# Patient Record
Sex: Male | Born: 1935 | Race: White | Hispanic: No | Marital: Married
Health system: Southern US, Community
[De-identification: ages and names within clinical notes are randomized; demographics above are authoritative.]

---

## 2003-11-25 ENCOUNTER — Ambulatory Visit: Payer: Self-pay | Admitting: Ophthalmology

## 2003-12-02 ENCOUNTER — Ambulatory Visit: Payer: Self-pay | Admitting: Ophthalmology

## 2004-03-23 ENCOUNTER — Ambulatory Visit: Payer: Self-pay | Admitting: Unknown Physician Specialty

## 2004-04-11 ENCOUNTER — Ambulatory Visit: Payer: Self-pay

## 2004-08-29 ENCOUNTER — Ambulatory Visit: Payer: Self-pay | Admitting: Unknown Physician Specialty

## 2004-09-21 ENCOUNTER — Emergency Department: Payer: Self-pay | Admitting: Emergency Medicine

## 2004-10-01 ENCOUNTER — Emergency Department: Payer: Self-pay | Admitting: Unknown Physician Specialty

## 2004-11-12 ENCOUNTER — Emergency Department: Payer: Self-pay | Admitting: Emergency Medicine

## 2005-02-09 ENCOUNTER — Emergency Department: Payer: Self-pay | Admitting: Emergency Medicine

## 2005-02-09 ENCOUNTER — Ambulatory Visit: Payer: Self-pay | Admitting: Family Medicine

## 2005-02-10 ENCOUNTER — Inpatient Hospital Stay: Payer: Self-pay | Admitting: Internal Medicine

## 2005-02-10 ENCOUNTER — Other Ambulatory Visit: Payer: Self-pay

## 2005-02-13 ENCOUNTER — Other Ambulatory Visit: Payer: Self-pay

## 2005-02-24 ENCOUNTER — Emergency Department: Payer: Self-pay | Admitting: Emergency Medicine

## 2005-03-22 ENCOUNTER — Encounter: Payer: Self-pay | Admitting: Internal Medicine

## 2005-04-13 ENCOUNTER — Encounter: Payer: Self-pay | Admitting: Internal Medicine

## 2005-05-14 ENCOUNTER — Encounter: Payer: Self-pay | Admitting: Internal Medicine

## 2006-03-05 ENCOUNTER — Encounter: Payer: Self-pay | Admitting: Internal Medicine

## 2006-03-16 ENCOUNTER — Encounter: Payer: Self-pay | Admitting: Internal Medicine

## 2006-04-04 ENCOUNTER — Ambulatory Visit: Payer: Self-pay | Admitting: Unknown Physician Specialty

## 2006-04-14 ENCOUNTER — Encounter: Payer: Self-pay | Admitting: Internal Medicine

## 2006-05-15 ENCOUNTER — Encounter: Payer: Self-pay | Admitting: Internal Medicine

## 2007-08-27 ENCOUNTER — Other Ambulatory Visit: Payer: Self-pay

## 2007-08-27 ENCOUNTER — Ambulatory Visit: Payer: Self-pay | Admitting: Surgery

## 2007-09-03 ENCOUNTER — Ambulatory Visit: Payer: Self-pay | Admitting: Surgery

## 2008-03-02 ENCOUNTER — Ambulatory Visit: Payer: Self-pay | Admitting: Internal Medicine

## 2008-03-24 ENCOUNTER — Encounter: Payer: Self-pay | Admitting: Internal Medicine

## 2008-04-13 ENCOUNTER — Encounter: Payer: Self-pay | Admitting: Internal Medicine

## 2008-07-14 ENCOUNTER — Ambulatory Visit: Payer: Self-pay | Admitting: Internal Medicine

## 2008-07-16 ENCOUNTER — Inpatient Hospital Stay: Payer: Self-pay | Admitting: Internal Medicine

## 2009-01-05 ENCOUNTER — Ambulatory Visit: Payer: Self-pay | Admitting: Ophthalmology

## 2009-01-20 ENCOUNTER — Ambulatory Visit: Payer: Self-pay | Admitting: Ophthalmology

## 2009-06-03 ENCOUNTER — Ambulatory Visit: Payer: Self-pay | Admitting: Unknown Physician Specialty

## 2009-12-30 ENCOUNTER — Other Ambulatory Visit: Payer: Self-pay | Admitting: Rheumatology

## 2010-09-09 ENCOUNTER — Ambulatory Visit: Payer: Self-pay | Admitting: Internal Medicine

## 2010-09-14 ENCOUNTER — Ambulatory Visit: Payer: Self-pay | Admitting: Internal Medicine

## 2010-10-15 ENCOUNTER — Ambulatory Visit: Payer: Self-pay | Admitting: Internal Medicine

## 2010-10-18 ENCOUNTER — Ambulatory Visit: Payer: Self-pay | Admitting: Internal Medicine

## 2010-10-24 ENCOUNTER — Emergency Department: Payer: Self-pay | Admitting: Emergency Medicine

## 2010-10-25 ENCOUNTER — Inpatient Hospital Stay: Payer: Self-pay | Admitting: Internal Medicine

## 2010-11-11 ENCOUNTER — Emergency Department: Payer: Self-pay | Admitting: Emergency Medicine

## 2010-11-14 ENCOUNTER — Ambulatory Visit: Payer: Self-pay | Admitting: Internal Medicine

## 2010-11-16 ENCOUNTER — Ambulatory Visit: Payer: Self-pay | Admitting: Internal Medicine

## 2011-02-20 ENCOUNTER — Ambulatory Visit: Payer: Self-pay | Admitting: Internal Medicine

## 2011-03-17 ENCOUNTER — Ambulatory Visit: Payer: Self-pay | Admitting: Internal Medicine

## 2011-04-14 ENCOUNTER — Ambulatory Visit: Payer: Self-pay | Admitting: Internal Medicine

## 2011-04-17 LAB — CBC CANCER CENTER
Basophil %: 0.9 %
Eosinophil %: 0.9 %
HGB: 16.9 g/dL (ref 13.0–18.0)
Lymphocyte #: 1.4 x10 3/mm (ref 1.0–3.6)
Lymphocyte %: 17.6 %
MCH: 31.5 pg (ref 26.0–34.0)
MCV: 93 fL (ref 80–100)
Monocyte #: 1.1 x10 3/mm — ABNORMAL HIGH (ref 0.0–0.7)
Monocyte %: 14.4 %
Neutrophil %: 66.2 %
RBC: 5.37 10*6/uL (ref 4.40–5.90)
WBC: 8 x10 3/mm (ref 3.8–10.6)

## 2011-05-15 ENCOUNTER — Ambulatory Visit: Payer: Self-pay | Admitting: Internal Medicine

## 2011-05-29 LAB — CANCER CENTER HEMATOCRIT: HCT: 52.1 % — ABNORMAL HIGH (ref 40.0–52.0)

## 2011-06-14 ENCOUNTER — Ambulatory Visit: Payer: Self-pay | Admitting: Internal Medicine

## 2011-07-15 ENCOUNTER — Ambulatory Visit: Payer: Self-pay | Admitting: Internal Medicine

## 2011-08-21 ENCOUNTER — Ambulatory Visit: Payer: Self-pay | Admitting: Internal Medicine

## 2011-08-21 LAB — CANCER CENTER HEMATOCRIT: HCT: 48.9 % (ref 40.0–52.0)

## 2011-09-14 ENCOUNTER — Ambulatory Visit: Payer: Self-pay | Admitting: Internal Medicine

## 2011-09-25 LAB — CBC CANCER CENTER
Basophil #: 0.1 x10 3/mm (ref 0.0–0.1)
HCT: 50.2 % (ref 40.0–52.0)
Lymphocyte #: 0.9 x10 3/mm — ABNORMAL LOW (ref 1.0–3.6)
Lymphocyte %: 13.4 %
MCV: 91 fL (ref 80–100)
Monocyte #: 1.1 x10 3/mm — ABNORMAL HIGH (ref 0.2–1.0)
Monocyte %: 15.7 %
Platelet: 135 x10 3/mm — ABNORMAL LOW (ref 150–440)
RBC: 5.51 10*6/uL (ref 4.40–5.90)
RDW: 14.5 % (ref 11.5–14.5)
WBC: 6.9 x10 3/mm (ref 3.8–10.6)

## 2011-10-15 ENCOUNTER — Ambulatory Visit: Payer: Self-pay | Admitting: Internal Medicine

## 2011-10-30 ENCOUNTER — Ambulatory Visit: Payer: Self-pay | Admitting: Surgery

## 2011-11-10 ENCOUNTER — Inpatient Hospital Stay: Payer: Self-pay | Admitting: Surgery

## 2011-11-12 LAB — CBC WITH DIFFERENTIAL/PLATELET
Basophil #: 0 10*3/uL (ref 0.0–0.1)
Basophil %: 0.3 %
Eosinophil %: 0.3 %
HCT: 31.3 % — ABNORMAL LOW (ref 40.0–52.0)
HGB: 10.8 g/dL — ABNORMAL LOW (ref 13.0–18.0)
Lymphocyte #: 0.9 10*3/uL — ABNORMAL LOW (ref 1.0–3.6)
Lymphocyte %: 5.9 %
MCV: 88 fL (ref 80–100)
Monocyte %: 16.8 %
Platelet: 123 10*3/uL — ABNORMAL LOW (ref 150–440)
RBC: 3.56 10*6/uL — ABNORMAL LOW (ref 4.40–5.90)
RDW: 16.1 % — ABNORMAL HIGH (ref 11.5–14.5)
WBC: 15.6 10*3/uL — ABNORMAL HIGH (ref 3.8–10.6)

## 2011-11-12 LAB — BASIC METABOLIC PANEL
BUN: 9 mg/dL (ref 7–18)
Calcium, Total: 8.3 mg/dL — ABNORMAL LOW (ref 8.5–10.1)
Creatinine: 1.01 mg/dL (ref 0.60–1.30)
EGFR (African American): >60
EGFR (Non-African Amer.): >60
Glucose: 102 mg/dL — ABNORMAL HIGH (ref 65–99)
Sodium: 127 mmol/L — ABNORMAL LOW (ref 136–145)

## 2011-11-13 LAB — CBC WITH DIFFERENTIAL/PLATELET
Basophil #: 0.1 10*3/uL (ref 0.0–0.1)
Eosinophil #: 0 10*3/uL (ref 0.0–0.7)
Eosinophil %: 0.2 %
HCT: 29.5 % — ABNORMAL LOW (ref 40.0–52.0)
Lymphocyte #: 1.1 10*3/uL (ref 1.0–3.6)
Lymphocyte %: 8.3 %
MCHC: 34 g/dL (ref 32.0–36.0)
Monocyte #: 2.1 x10 3/mm — ABNORMAL HIGH (ref 0.2–1.0)
Neutrophil %: 75.7 %
Platelet: 124 10*3/uL — ABNORMAL LOW (ref 150–440)
RDW: 15.9 % — ABNORMAL HIGH (ref 11.5–14.5)

## 2011-11-13 LAB — BASIC METABOLIC PANEL
Anion Gap: 8 (ref 7–16)
Calcium, Total: 8.4 mg/dL — ABNORMAL LOW (ref 8.5–10.1)
Chloride: 89 mmol/L — ABNORMAL LOW (ref 98–107)
EGFR (Non-African Amer.): >60
Glucose: 95 mg/dL (ref 65–99)
Osmolality: 255 (ref 275–301)
Potassium: 4.2 mmol/L (ref 3.5–5.1)
Sodium: 128 mmol/L — ABNORMAL LOW (ref 136–145)

## 2011-11-14 LAB — CBC WITH DIFFERENTIAL/PLATELET
Basophil #: 0.1 10*3/uL (ref 0.0–0.1)
Eosinophil #: 0 10*3/uL (ref 0.0–0.7)
HCT: 32.4 % — ABNORMAL LOW (ref 40.0–52.0)
HGB: 10.9 g/dL — ABNORMAL LOW (ref 13.0–18.0)
Lymphocyte #: 1.1 10*3/uL (ref 1.0–3.6)
MCHC: 33.5 g/dL (ref 32.0–36.0)
MCV: 89 fL (ref 80–100)
Monocyte #: 2.5 x10 3/mm — ABNORMAL HIGH (ref 0.2–1.0)
Monocyte %: 17.5 %
Neutrophil #: 10.4 10*3/uL — ABNORMAL HIGH (ref 1.4–6.5)
Platelet: 175 10*3/uL (ref 150–440)
RDW: 16.2 % — ABNORMAL HIGH (ref 11.5–14.5)
WBC: 14 10*3/uL — ABNORMAL HIGH (ref 3.8–10.6)

## 2011-11-14 LAB — BASIC METABOLIC PANEL
Calcium, Total: 8.5 mg/dL (ref 8.5–10.1)
Chloride: 92 mmol/L — ABNORMAL LOW (ref 98–107)
Co2: 32 mmol/L (ref 21–32)
Creatinine: 0.88 mg/dL (ref 0.60–1.30)
EGFR (African American): >60
Osmolality: 265 (ref 275–301)
Potassium: 3.7 mmol/L (ref 3.5–5.1)

## 2011-11-15 LAB — CBC WITH DIFFERENTIAL/PLATELET
Bands: 4 %
HGB: 11.6 g/dL — ABNORMAL LOW (ref 13.0–18.0)
MCH: 31.3 pg (ref 26.0–34.0)
MCHC: 35 g/dL (ref 32.0–36.0)
MCV: 89 fL (ref 80–100)
Metamyelocyte: 1 %
Monocytes: 14 %
Myelocyte: 2 %
Platelet: 192 10*3/uL (ref 150–440)
RBC: 3.69 10*6/uL — ABNORMAL LOW (ref 4.40–5.90)
RDW: 16.6 % — ABNORMAL HIGH (ref 11.5–14.5)
Variant Lymphocyte - H1-Rlymph: 6 %

## 2011-11-15 LAB — BASIC METABOLIC PANEL
Anion Gap: 9 (ref 7–16)
Chloride: 89 mmol/L — ABNORMAL LOW (ref 98–107)
Co2: 32 mmol/L (ref 21–32)
Creatinine: 1.27 mg/dL (ref 0.60–1.30)
EGFR (African American): >60
EGFR (Non-African Amer.): 53 — ABNORMAL LOW
Osmolality: 264 (ref 275–301)
Sodium: 130 mmol/L — ABNORMAL LOW (ref 136–145)

## 2011-11-15 LAB — TSH: Thyroid Stimulating Horm: 1.55 u[IU]/mL

## 2011-11-16 LAB — CBC WITH DIFFERENTIAL/PLATELET
Bands: 5 %
Comment - H1-Com3: NORMAL
HCT: 33.4 % — ABNORMAL LOW (ref 40.0–52.0)
MCH: 30.9 pg (ref 26.0–34.0)
MCHC: 34.4 g/dL (ref 32.0–36.0)
MCV: 90 fL (ref 80–100)
Metamyelocyte: 2 %
Monocytes: 15 %
Platelet: 213 10*3/uL (ref 150–440)
Segmented Neutrophils: 76 %
WBC: 16.2 10*3/uL — ABNORMAL HIGH (ref 3.8–10.6)

## 2011-11-16 LAB — BASIC METABOLIC PANEL
Anion Gap: 5 — ABNORMAL LOW (ref 7–16)
BUN: 16 mg/dL (ref 7–18)
Calcium, Total: 8.6 mg/dL (ref 8.5–10.1)
Chloride: 88 mmol/L — ABNORMAL LOW (ref 98–107)
EGFR (African American): >60
EGFR (Non-African Amer.): >60
Glucose: 101 mg/dL — ABNORMAL HIGH (ref 65–99)
Osmolality: 264 (ref 275–301)

## 2011-11-22 LAB — CBC WITH DIFFERENTIAL/PLATELET
Basophil %: 0.6 %
Eosinophil #: 0.2 10*3/uL (ref 0.0–0.7)
HCT: 38.9 % — ABNORMAL LOW (ref 40.0–52.0)
HGB: 13.3 g/dL (ref 13.0–18.0)
Lymphocyte #: 1.2 10*3/uL (ref 1.0–3.6)
Lymphocyte %: 10.8 %
MCHC: 34.2 g/dL (ref 32.0–36.0)
MCV: 91 fL (ref 80–100)
Monocyte %: 13.6 %
Neutrophil #: 7.9 10*3/uL — ABNORMAL HIGH (ref 1.4–6.5)
RBC: 4.28 10*6/uL — ABNORMAL LOW (ref 4.40–5.90)

## 2011-11-22 LAB — BASIC METABOLIC PANEL
Anion Gap: 8 (ref 7–16)
BUN: 7 mg/dL (ref 7–18)
Chloride: 94 mmol/L — ABNORMAL LOW (ref 98–107)
Creatinine: 0.7 mg/dL (ref 0.60–1.30)
EGFR (African American): >60
EGFR (Non-African Amer.): >60
Glucose: 102 mg/dL — ABNORMAL HIGH (ref 65–99)
Osmolality: 268 (ref 275–301)

## 2011-11-30 ENCOUNTER — Inpatient Hospital Stay: Payer: Self-pay | Admitting: Internal Medicine

## 2011-11-30 LAB — CBC
HCT: 49.6 % (ref 40.0–52.0)
HGB: 17 g/dL (ref 13.0–18.0)
MCH: 30.7 pg (ref 26.0–34.0)
MCV: 90 fL (ref 80–100)
Platelet: 256 10*3/uL (ref 150–440)
RBC: 5.52 10*6/uL (ref 4.40–5.90)
RDW: 17.6 % — ABNORMAL HIGH (ref 11.5–14.5)
WBC: 7 10*3/uL (ref 3.8–10.6)

## 2011-11-30 LAB — URINALYSIS, COMPLETE
Bilirubin,UR: NEGATIVE
Blood: NEGATIVE
Glucose,UR: NEGATIVE mg/dL (ref 0–75)
Leukocyte Esterase: NEGATIVE
Ph: 6 (ref 4.5–8.0)
RBC,UR: 1 /HPF (ref 0–5)
Squamous Epithelial: NONE SEEN
WBC UR: 2 /HPF (ref 0–5)

## 2011-11-30 LAB — TROPONIN I: Troponin-I: <0.02 ng/mL

## 2011-11-30 LAB — LIPASE, BLOOD: Lipase: 88 U/L (ref 73–393)

## 2011-11-30 LAB — MAGNESIUM: Magnesium: 1.8 mg/dL

## 2011-11-30 LAB — COMPREHENSIVE METABOLIC PANEL
Albumin: 4 g/dL (ref 3.4–5.0)
Alkaline Phosphatase: 93 U/L (ref 50–136)
Bilirubin,Total: 1.9 mg/dL — ABNORMAL HIGH (ref 0.2–1.0)
Calcium, Total: 9.7 mg/dL (ref 8.5–10.1)
Co2: 33 mmol/L — ABNORMAL HIGH (ref 21–32)
EGFR (Non-African Amer.): >60
Glucose: 94 mg/dL (ref 65–99)
Osmolality: 261 (ref 275–301)
SGOT(AST): 27 U/L (ref 15–37)
SGPT (ALT): 39 U/L (ref 12–78)

## 2011-11-30 LAB — TSH: Thyroid Stimulating Horm: 2.18 u[IU]/mL

## 2011-12-01 ENCOUNTER — Ambulatory Visit: Payer: Self-pay | Admitting: Internal Medicine

## 2011-12-01 LAB — BASIC METABOLIC PANEL
Calcium, Total: 9 mg/dL (ref 8.5–10.1)
Chloride: 95 mmol/L — ABNORMAL LOW (ref 98–107)
Creatinine: 0.77 mg/dL (ref 0.60–1.30)
EGFR (African American): >60
EGFR (Non-African Amer.): >60
Glucose: 80 mg/dL (ref 65–99)
Potassium: 3.4 mmol/L — ABNORMAL LOW (ref 3.5–5.1)
Sodium: 135 mmol/L — ABNORMAL LOW (ref 136–145)

## 2011-12-01 LAB — CBC WITH DIFFERENTIAL/PLATELET
Basophil %: 1.3 %
Eosinophil #: 0.1 10*3/uL (ref 0.0–0.7)
Eosinophil %: 1.2 %
HCT: 43.1 % (ref 40.0–52.0)
HGB: 14.6 g/dL (ref 13.0–18.0)
Lymphocyte #: 1.3 10*3/uL (ref 1.0–3.6)
MCH: 30.3 pg (ref 26.0–34.0)
MCHC: 33.8 g/dL (ref 32.0–36.0)
MCV: 90 fL (ref 80–100)
Monocyte #: 0.9 x10 3/mm (ref 0.2–1.0)
Neutrophil #: 4.2 10*3/uL (ref 1.4–6.5)
Neutrophil %: 64.2 %
Platelet: 199 10*3/uL (ref 150–440)
RBC: 4.8 10*6/uL (ref 4.40–5.90)

## 2011-12-02 LAB — COMPREHENSIVE METABOLIC PANEL
Albumin: 3.2 g/dL — ABNORMAL LOW (ref 3.4–5.0)
Alkaline Phosphatase: 76 U/L (ref 50–136)
Anion Gap: 8 (ref 7–16)
BUN: 6 mg/dL — ABNORMAL LOW (ref 7–18)
Calcium, Total: 8.5 mg/dL (ref 8.5–10.1)
Chloride: 102 mmol/L (ref 98–107)
Creatinine: 0.83 mg/dL (ref 0.60–1.30)
Glucose: 76 mg/dL (ref 65–99)
Potassium: 3.4 mmol/L — ABNORMAL LOW (ref 3.5–5.1)
SGOT(AST): 26 U/L (ref 15–37)
SGPT (ALT): 27 U/L (ref 12–78)
Total Protein: 6.1 g/dL — ABNORMAL LOW (ref 6.4–8.2)

## 2011-12-03 LAB — CBC WITH DIFFERENTIAL/PLATELET
Basophil %: 1.5 %
Eosinophil %: 1.8 %
HGB: 14.8 g/dL (ref 13.0–18.0)
Lymphocyte #: 1.2 10*3/uL (ref 1.0–3.6)
MCH: 30 pg (ref 26.0–34.0)
MCV: 91 fL (ref 80–100)
Monocyte #: 0.8 x10 3/mm (ref 0.2–1.0)
Neutrophil #: 3.3 10*3/uL (ref 1.4–6.5)
Neutrophil %: 59.9 %
RBC: 4.94 10*6/uL (ref 4.40–5.90)

## 2011-12-03 LAB — COMPREHENSIVE METABOLIC PANEL
Albumin: 3.2 g/dL — ABNORMAL LOW (ref 3.4–5.0)
Anion Gap: 7 (ref 7–16)
Bilirubin,Total: 1.4 mg/dL — ABNORMAL HIGH (ref 0.2–1.0)
Calcium, Total: 8.9 mg/dL (ref 8.5–10.1)
Creatinine: 0.83 mg/dL (ref 0.60–1.30)
Glucose: 77 mg/dL (ref 65–99)
Potassium: 3.9 mmol/L (ref 3.5–5.1)
SGOT(AST): 24 U/L (ref 15–37)
SGPT (ALT): 23 U/L (ref 12–78)
Total Protein: 5.9 g/dL — ABNORMAL LOW (ref 6.4–8.2)

## 2011-12-03 LAB — LIPASE, BLOOD: Lipase: 114 U/L (ref 73–393)

## 2011-12-04 LAB — BASIC METABOLIC PANEL
Anion Gap: 8 (ref 7–16)
BUN: 6 mg/dL — ABNORMAL LOW (ref 7–18)
Calcium, Total: 9.2 mg/dL (ref 8.5–10.1)
Creatinine: 0.76 mg/dL (ref 0.60–1.30)
EGFR (African American): >60
EGFR (Non-African Amer.): >60
Osmolality: 274 (ref 275–301)
Sodium: 139 mmol/L (ref 136–145)

## 2011-12-06 ENCOUNTER — Emergency Department: Payer: Self-pay | Admitting: Emergency Medicine

## 2011-12-06 LAB — COMPREHENSIVE METABOLIC PANEL
Albumin: 3.7 g/dL (ref 3.4–5.0)
Alkaline Phosphatase: 76 U/L (ref 50–136)
Calcium, Total: 8.9 mg/dL (ref 8.5–10.1)
Chloride: 94 mmol/L — ABNORMAL LOW (ref 98–107)
Glucose: 116 mg/dL — ABNORMAL HIGH (ref 65–99)
SGOT(AST): 29 U/L (ref 15–37)
SGPT (ALT): 22 U/L (ref 12–78)
Sodium: 135 mmol/L — ABNORMAL LOW (ref 136–145)

## 2011-12-06 LAB — CBC
HCT: 46 % (ref 40.0–52.0)
HGB: 15.5 g/dL (ref 13.0–18.0)
MCV: 91 fL (ref 80–100)
RBC: 5.06 10*6/uL (ref 4.40–5.90)
RDW: 17.2 % — ABNORMAL HIGH (ref 11.5–14.5)
WBC: 9.4 10*3/uL (ref 3.8–10.6)

## 2011-12-07 ENCOUNTER — Observation Stay: Payer: Self-pay | Admitting: Surgery

## 2011-12-07 LAB — URINALYSIS, COMPLETE
Bacteria: NONE SEEN
Bilirubin,UR: NEGATIVE
Blood: NEGATIVE
Hyaline Cast: 27
Ketone: NEGATIVE
RBC,UR: <1 /HPF (ref 0–5)
Specific Gravity: 1.006 (ref 1.003–1.030)
Squamous Epithelial: <1
WBC UR: 1 /HPF (ref 0–5)

## 2011-12-08 LAB — COMPREHENSIVE METABOLIC PANEL
Albumin: 3.5 g/dL (ref 3.4–5.0)
Alkaline Phosphatase: 81 U/L (ref 50–136)
Bilirubin,Total: 1.2 mg/dL — ABNORMAL HIGH (ref 0.2–1.0)
Co2: 32 mmol/L (ref 21–32)
Creatinine: 0.89 mg/dL (ref 0.60–1.30)
EGFR (Non-African Amer.): >60
Glucose: 89 mg/dL (ref 65–99)
Osmolality: 262 (ref 275–301)
Sodium: 132 mmol/L — ABNORMAL LOW (ref 136–145)

## 2011-12-08 LAB — CBC WITH DIFFERENTIAL/PLATELET
Basophil #: 0.1 10*3/uL (ref 0.0–0.1)
Basophil %: 1.8 %
Eosinophil #: 0.1 10*3/uL (ref 0.0–0.7)
HCT: 44.1 % (ref 40.0–52.0)
HGB: 14.9 g/dL (ref 13.0–18.0)
Lymphocyte %: 23.3 %
MCHC: 33.8 g/dL (ref 32.0–36.0)
MCV: 90 fL (ref 80–100)
Monocyte %: 18.2 %
Neutrophil #: 3.4 10*3/uL (ref 1.4–6.5)
Neutrophil %: 55.6 %
Platelet: 165 10*3/uL (ref 150–440)
RDW: 17 % — ABNORMAL HIGH (ref 11.5–14.5)
WBC: 6.2 10*3/uL (ref 3.8–10.6)

## 2011-12-15 ENCOUNTER — Ambulatory Visit: Payer: Self-pay | Admitting: Internal Medicine

## 2012-01-15 ENCOUNTER — Ambulatory Visit: Payer: Self-pay | Admitting: Internal Medicine

## 2012-01-15 LAB — HEMATOCRIT: HCT: 42.4 % (ref 40.0–52.0)

## 2012-02-14 ENCOUNTER — Ambulatory Visit: Payer: Self-pay | Admitting: Internal Medicine

## 2012-03-18 ENCOUNTER — Ambulatory Visit: Payer: Self-pay | Admitting: Internal Medicine

## 2012-03-18 LAB — CBC CANCER CENTER
Eosinophil #: 0.1 x10 3/mm (ref 0.0–0.7)
HCT: 43.2 % (ref 40.0–52.0)
HGB: 14.7 g/dL (ref 13.0–18.0)
Lymphocyte %: 19.4 %
MCV: 92 fL (ref 80–100)
Monocyte #: 1 x10 3/mm (ref 0.2–1.0)
Monocyte %: 11.8 %
Neutrophil #: 5.6 x10 3/mm (ref 1.4–6.5)
Neutrophil %: 66.3 %
Platelet: 169 x10 3/mm (ref 150–440)
RBC: 4.68 10*6/uL (ref 4.40–5.90)
RDW: 14.2 % (ref 11.5–14.5)
WBC: 8.4 x10 3/mm (ref 3.8–10.6)

## 2012-04-13 ENCOUNTER — Ambulatory Visit: Payer: Self-pay | Admitting: Internal Medicine

## 2012-04-26 ENCOUNTER — Ambulatory Visit: Payer: Self-pay | Admitting: Internal Medicine

## 2012-12-09 LAB — COMPREHENSIVE METABOLIC PANEL
Albumin: 4 g/dL (ref 3.4–5.0)
Alkaline Phosphatase: 107 U/L (ref 50–136)
Anion Gap: 5 — ABNORMAL LOW (ref 7–16)
BUN: 15 mg/dL (ref 7–18)
Co2: 42 mmol/L (ref 21–32)
Creatinine: 1.16 mg/dL (ref 0.60–1.30)
Osmolality: 257 (ref 275–301)
SGOT(AST): 39 U/L — ABNORMAL HIGH (ref 15–37)
SGPT (ALT): 25 U/L (ref 12–78)
Sodium: 127 mmol/L — ABNORMAL LOW (ref 136–145)
Total Protein: 7.4 g/dL (ref 6.4–8.2)

## 2012-12-09 LAB — LIPASE, BLOOD: Lipase: 65 U/L — ABNORMAL LOW (ref 73–393)

## 2012-12-09 LAB — URINALYSIS, COMPLETE
Bacteria: NONE SEEN
Bilirubin,UR: NEGATIVE
Leukocyte Esterase: NEGATIVE
Nitrite: NEGATIVE
RBC,UR: NONE SEEN /HPF (ref 0–5)
Squamous Epithelial: NONE SEEN
WBC UR: NONE SEEN /HPF (ref 0–5)

## 2012-12-09 LAB — CK TOTAL AND CKMB (NOT AT ARMC)
CK, Total: 170 U/L (ref 35–232)
CK-MB: 5.2 ng/mL — ABNORMAL HIGH (ref 0.5–3.6)

## 2012-12-09 LAB — CBC
MCH: 28.5 pg (ref 26.0–34.0)
MCV: 84 fL (ref 80–100)
RBC: 5.47 10*6/uL (ref 4.40–5.90)
RDW: 14.5 % (ref 11.5–14.5)
WBC: 10.2 10*3/uL (ref 3.8–10.6)

## 2012-12-10 ENCOUNTER — Inpatient Hospital Stay: Payer: Self-pay | Admitting: Internal Medicine

## 2012-12-10 ENCOUNTER — Ambulatory Visit: Payer: Self-pay | Admitting: Internal Medicine

## 2012-12-10 LAB — TROPONIN I
Troponin-I: <0.02 ng/mL
Troponin-I: <0.02 ng/mL

## 2012-12-10 LAB — CK TOTAL AND CKMB (NOT AT ARMC)
CK, Total: 418 U/L — ABNORMAL HIGH (ref 35–232)
CK, Total: 497 U/L — ABNORMAL HIGH (ref 35–232)
CK-MB: 9.7 ng/mL — ABNORMAL HIGH (ref 0.5–3.6)

## 2012-12-10 LAB — MAGNESIUM: Magnesium: 1.6 mg/dL — ABNORMAL LOW

## 2012-12-10 LAB — PRO B NATRIURETIC PEPTIDE: B-Type Natriuretic Peptide: 2040 pg/mL — ABNORMAL HIGH (ref 0–450)

## 2012-12-11 LAB — BASIC METABOLIC PANEL
BUN: 10 mg/dL (ref 7–18)
EGFR (African American): >60
EGFR (Non-African Amer.): >60
Osmolality: 253 (ref 275–301)
Potassium: 3.1 mmol/L — ABNORMAL LOW (ref 3.5–5.1)

## 2012-12-11 LAB — CBC WITH DIFFERENTIAL/PLATELET
Basophil %: 0.7 %
Eosinophil #: 0.1 10*3/uL (ref 0.0–0.7)
Eosinophil %: 1 %
HCT: 46.4 % (ref 40.0–52.0)
HGB: 15.9 g/dL (ref 13.0–18.0)
Lymphocyte #: 1.5 10*3/uL (ref 1.0–3.6)
Lymphocyte %: 15 %
MCH: 28.9 pg (ref 26.0–34.0)
MCHC: 34.3 g/dL (ref 32.0–36.0)
MCV: 84 fL (ref 80–100)
Monocyte #: 1.5 x10 3/mm — ABNORMAL HIGH (ref 0.2–1.0)
Neutrophil %: 68.2 %
RDW: 14.8 % — ABNORMAL HIGH (ref 11.5–14.5)
WBC: 10.1 10*3/uL (ref 3.8–10.6)

## 2012-12-11 LAB — MAGNESIUM: Magnesium: 1.8 mg/dL

## 2012-12-12 LAB — CBC WITH DIFFERENTIAL/PLATELET
Basophil #: 0.1 10*3/uL (ref 0.0–0.1)
Basophil %: 1.1 %
Eosinophil #: 0.1 10*3/uL (ref 0.0–0.7)
Eosinophil %: 1 %
HCT: 46.9 % (ref 40.0–52.0)
HGB: 16.1 g/dL (ref 13.0–18.0)
Lymphocyte #: 1.5 10*3/uL (ref 1.0–3.6)
MCH: 28.8 pg (ref 26.0–34.0)
MCHC: 34.4 g/dL (ref 32.0–36.0)
MCV: 84 fL (ref 80–100)
Monocyte #: 1.5 x10 3/mm — ABNORMAL HIGH (ref 0.2–1.0)
Monocyte %: 15.2 %
Platelet: 139 10*3/uL — ABNORMAL LOW (ref 150–440)
RBC: 5.61 10*6/uL (ref 4.40–5.90)
RDW: 14.9 % — ABNORMAL HIGH (ref 11.5–14.5)
WBC: 9.8 10*3/uL (ref 3.8–10.6)

## 2012-12-12 LAB — BASIC METABOLIC PANEL
Anion Gap: 4 — ABNORMAL LOW (ref 7–16)
Calcium, Total: 9.1 mg/dL (ref 8.5–10.1)
Co2: 34 mmol/L — ABNORMAL HIGH (ref 21–32)
Creatinine: 0.9 mg/dL (ref 0.60–1.30)
EGFR (African American): >60
Potassium: 4 mmol/L (ref 3.5–5.1)

## 2012-12-14 ENCOUNTER — Ambulatory Visit: Payer: Self-pay | Admitting: Internal Medicine

## 2012-12-15 LAB — CULTURE, BLOOD (SINGLE)

## 2013-04-13 DEATH — deceased

## 2013-04-14 IMAGING — CT CT ABD-PELV W/O CM
1 of 2 series · 14 of 32 positions shown, 18 images · non-contrast
Comparison: none

REASON FOR EXAM: (1) left abd mass post surgery; (2) vomiting and abd
mass post surgical
COMMENTS:

[Series 2: soft tissue · axial · 0.84mm/px · z∈[-1002,-556]mm · 14 of 170 slices shown, 18 images]
[im 14/170  soft-tissue]
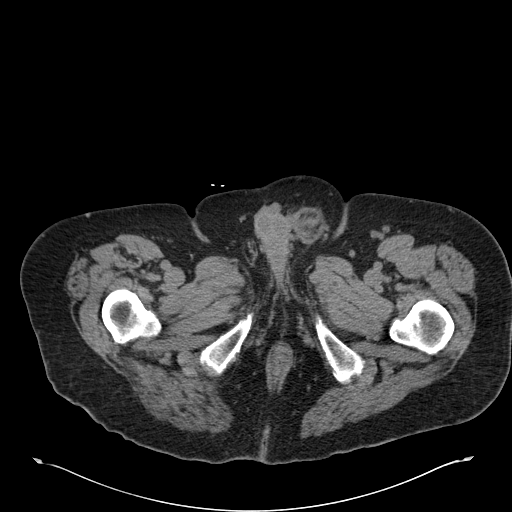
[im 14/170  bone]
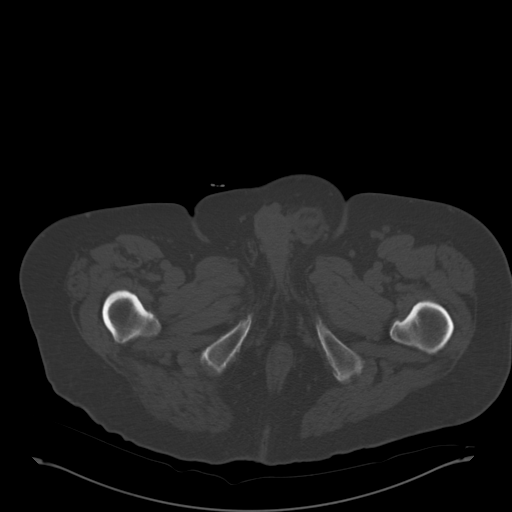
[im 28/170  soft-tissue]
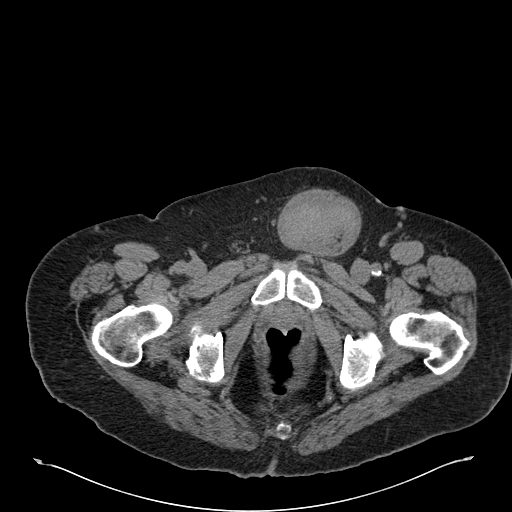
[im 41/170  soft-tissue]
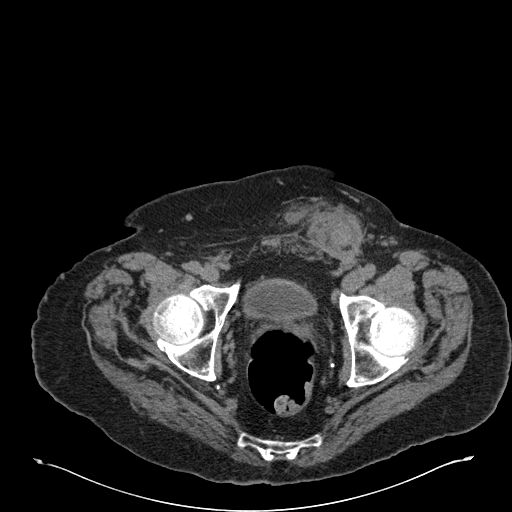
[im 55/170  soft-tissue]
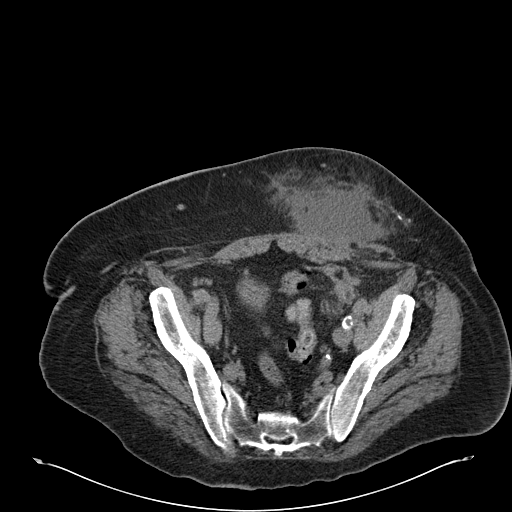
[im 68/170  soft-tissue]
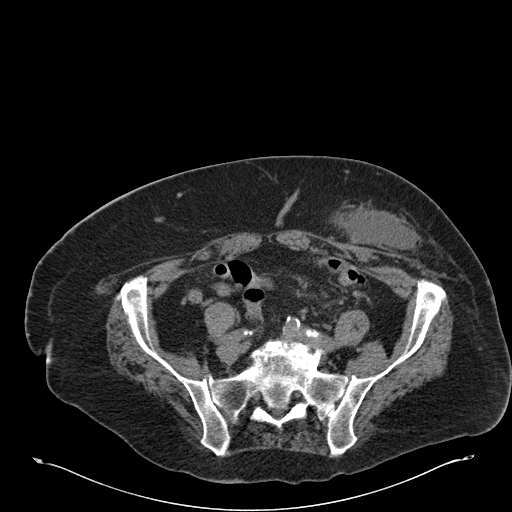
[im 82/170  soft-tissue]
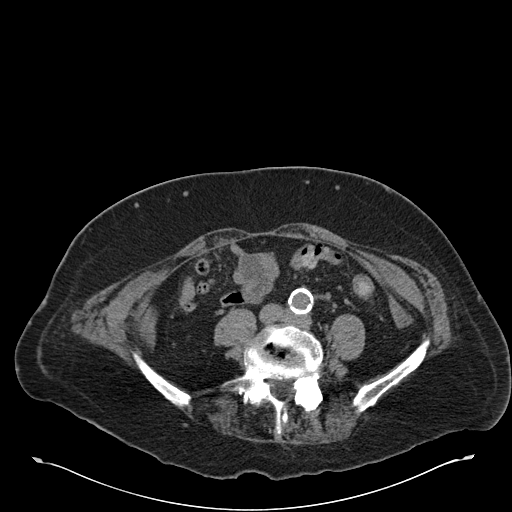
[im 95/170  soft-tissue]
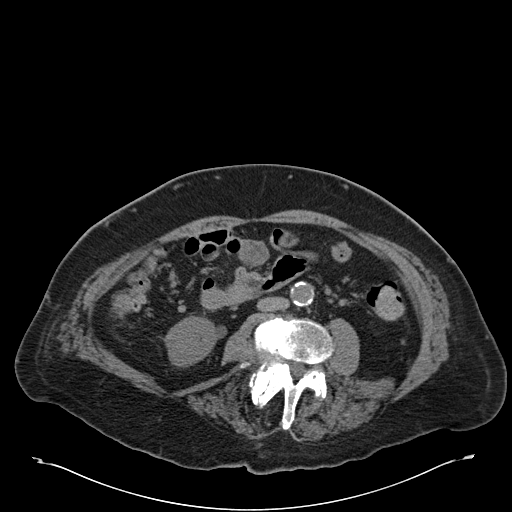
[im 109/170  soft-tissue]
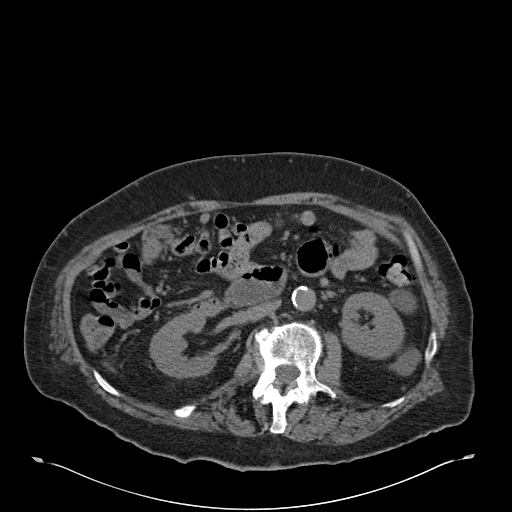
[im 122/170  soft-tissue]
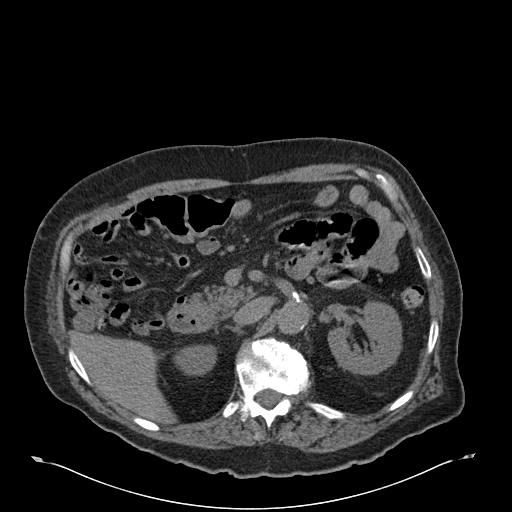
[im 122/170  bone]
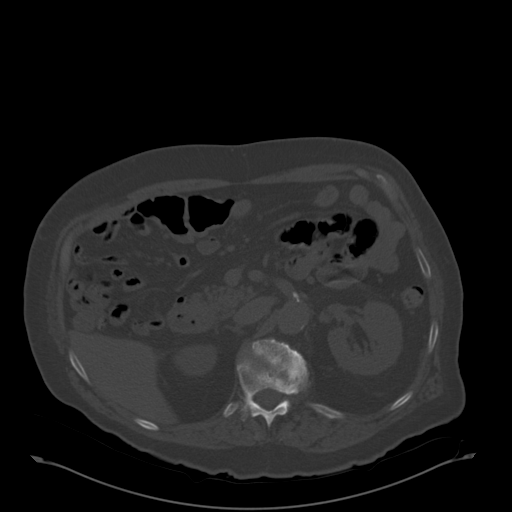
[im 136/170  soft-tissue]
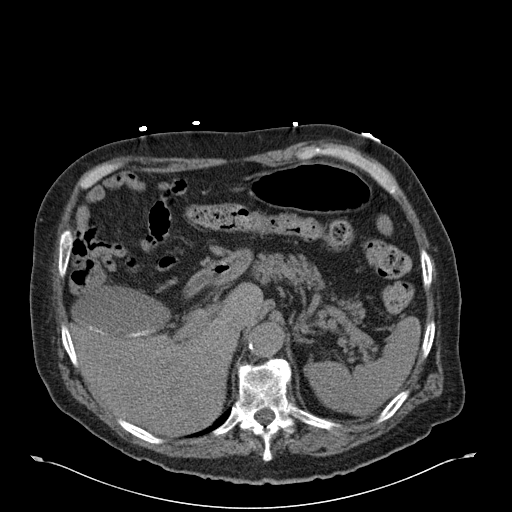
[im 142/170  lung]
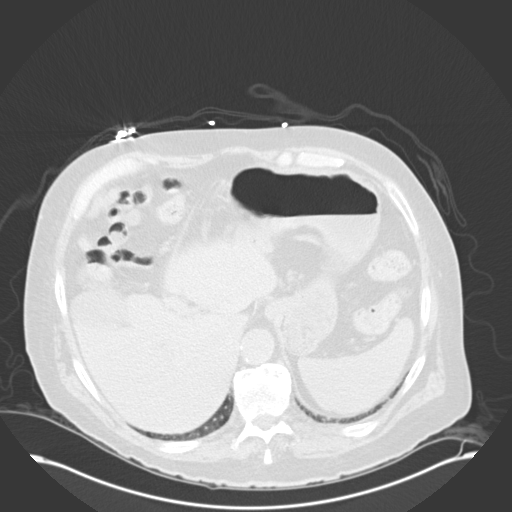
[im 149/170  soft-tissue]
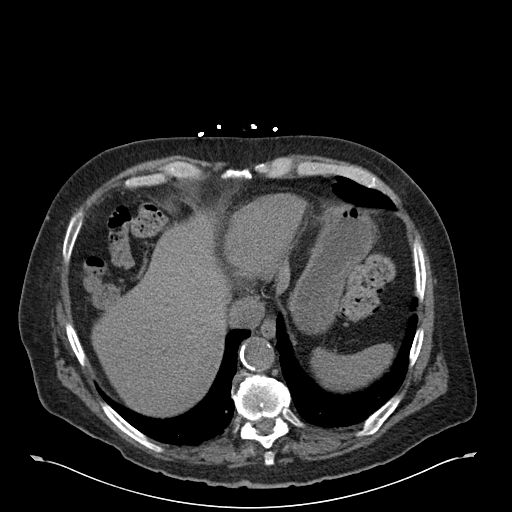
[im 149/170  lung]
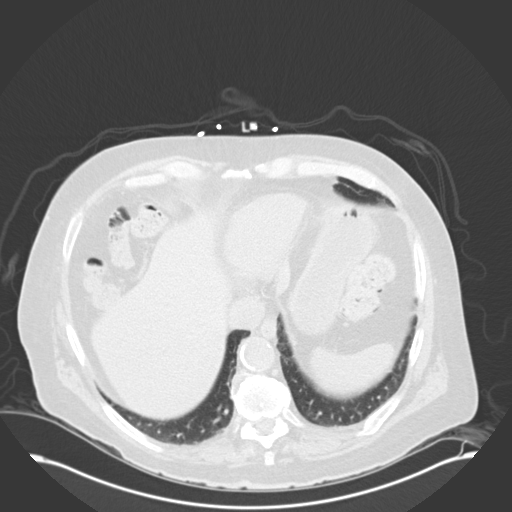
[im 156/170  lung]
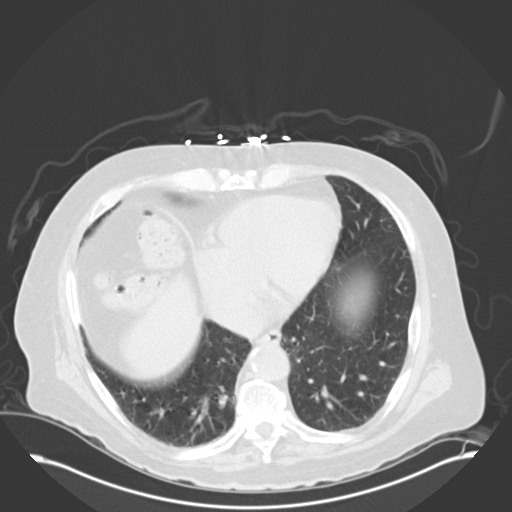
[im 163/170  soft-tissue]
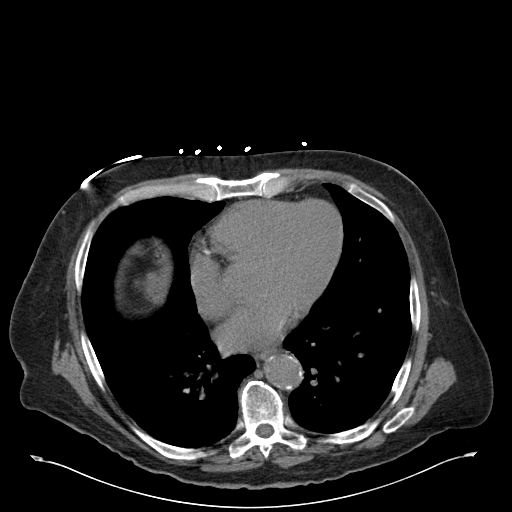
[im 163/170  lung]
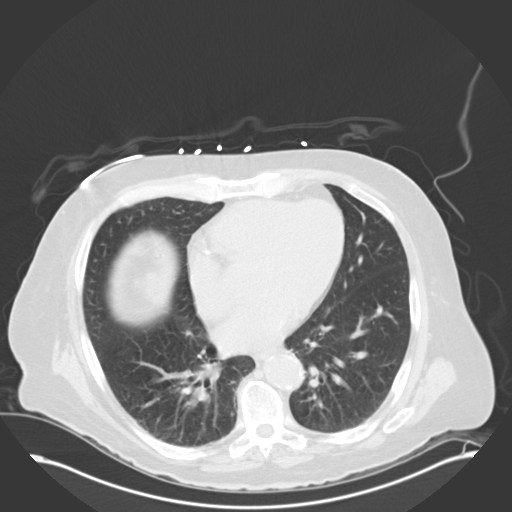

[14 of 32 positions shown; findings below may reference images not displayed]

PROCEDURE:     CT  - CT ABDOMEN AND PELVIS W[DATE] [DATE]

RESULT:     CT of the abdomen and pelvis without contrast is reconstructed
at 3 mm slice thickness in the axial plane and compared to previous study 16 November, 2010.

There is subcutaneous density in the left pelvis which may represent a
postoperative hematoma. There is a left inguinal hernia with fluid. The
distal extent of this is not seen. The scrotum is not included. Correlate
clinically. The left inguinal hernia was present previously. It appears that
there is some unopacified bowel within the hernia sac. There is no definite
bowel obstruction or evidence of bowel perforation. Some minimal stranding
is seen in the left lower quadrant adjacent to the proximal sigmoid colon
which could be postoperative in nature. Mild inflammation or infectious
process is felt to be less likely given the recent surgery and other changes
mentioned above. Prominent atherosclerotic calcification is present in the
aorta and its branches. There is a low attenuation mass arising from the mid
left kidney with a Hounsfield reading of 1.0 and anterior-posterior
dimension of 3.2 cm likely representing a large cyst area the second lesion
is present more anteriorly and inferiorly exophytic from the
anterior-inferior aspect of the left kidney and also has an appearance
suggestive of a cyst. This is unchanged from previous exam and shows a
medial to lateral dimension of 2.9 cm. No nephrolithiasis or hydronephrosis
is present. The adrenal glands are unremarkable. The liver, spleen and
pancreas appear unremarkable. Layering small stones are again seen in the
gallbladder. The lung bases appear clear.
IMPRESSION: 1. Presumed postoperative subcutaneous hematoma. An abscess is felt to be
less likely. There is fluid within the left inguinal hernia extending into
the inguinal canal toward scrotum. There may be some nondistended loops of
bowel within this as well. There is some presumed post operative change in
the left lower quadrant adjacent to the proximal sigmoid.
2. Cholelithiasis.

[REDACTED]

## 2013-04-20 IMAGING — CR DG ABDOMEN 1V
1 series · 2 of 2 positions shown · non-contrast
Comparison: none

REASON FOR EXAM: constipation
COMMENTS:

PROCEDURE:     DXR - DXR KIDNEY URETER BLADDER  - December 06, 2011 [DATE]
RESULT:     Stool in colon. No bowel distention. Scoliosis lumbar spine
concave right. Degenerative changes lumbar spine and both hips. Aortoiliac
atherosclerotic vascular disease.

[Series 12: t abdomen supine · 0.14mm/px · 2 of 2 slices shown]
[im 1/2]
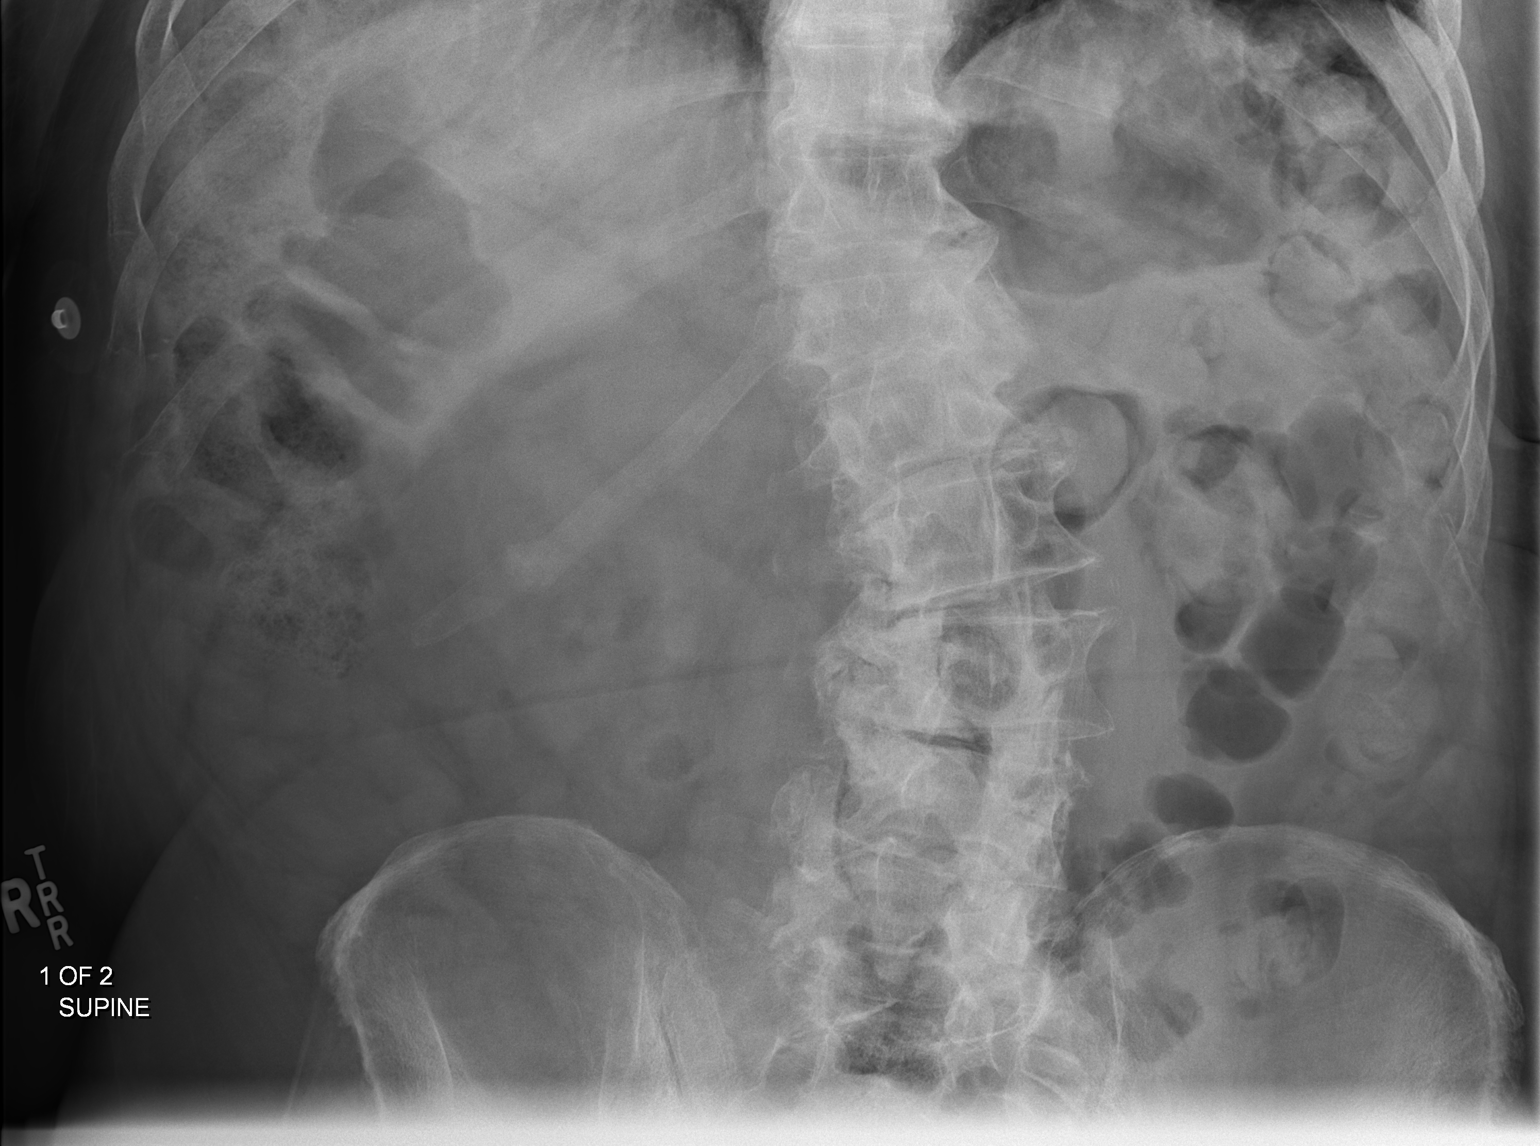
[im 2/2]
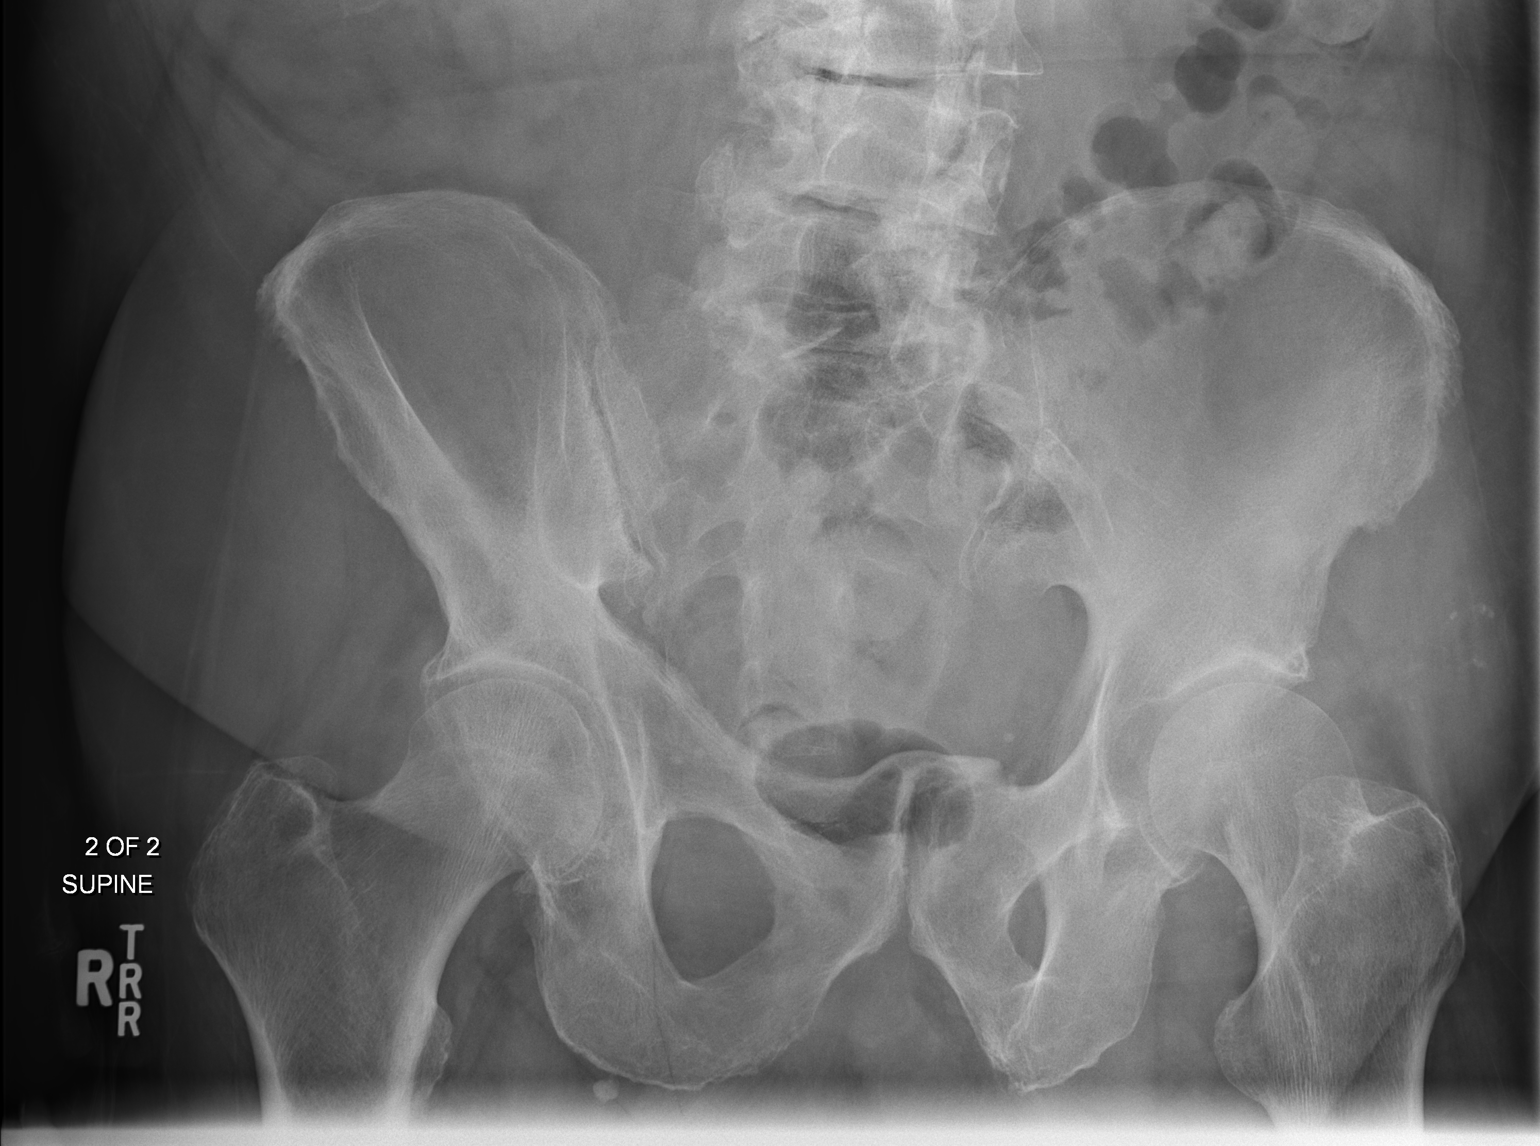

[2 of 2 positions shown; findings below may reference images not displayed]

IMPRESSION: Stool noted throughout the colon.
2. Aortoiliac atherosclerotic disease.
3. Scoliosis lumbar spine with degenerative changes of the spine and both
hips. Incidental note is made of an old right twelfth rib fracture.

## 2014-06-02 NOTE — Discharge Summary (Signed)
PATIENT NAME:  Andrew [name], Andrew [name] MR#:  [id number] DATE OF BIRTH:  1930/04/03  DATE OF ADMISSION:  11/30/2011 DATE OF DISCHARGE:  12/04/2011  REASON FOR ADMISSION: Intractable nausea and vomiting.   HISTORY OF PRESENT ILLNESS: Please see the dictated history of present illness done by Dr. Auburn BilberryShreyang Patel on 11/30/2011.   PAST MEDICAL HISTORY:  1. Chronic respiratory failure, on oxygen.  2. Pulmonary hypertension.  3. Chronic obstructive pulmonary disease.  4. Sleep apnea, on BiPAP.  5. Allergic rhinitis.  6. Rheumatoid arthritis.  7. Osteoarthritis.  8. Chronic systolic congestive heart failure.  9. Status post left inguinal hernia repair.  10. History of colonic polyps.   MEDICATIONS ON ADMISSION: Please see admission note.   ALLERGIES: Ativan, Klonopin, and penicillin.   SOCIAL HISTORY, FAMILY HISTORY, AND REVIEW OF SYSTEMS: As per admission note.   PHYSICAL EXAM: The patient was in no acute distress. Vital signs were stable and he was afebrile. HEENT exam was unremarkable. Neck was supple without JVD. Lungs revealed decreased breath sounds. Cardiac exam revealed a regular rate and rhythm with normal S1 and S2. Abdomen was soft and nondistended. No rebound or tenderness. No guarding. Hypoactive bowel sounds. Extremities were without edema. Neurologic exam was grossly nonfocal.   HOSPITAL COURSE: The patient was admitted with intractable nausea and vomiting. He was placed on IV Protonix with Carafate and Reglan. He was seen in consultation by Gastroenterology who started the patient on Zofran every six hours routinely. He was given IV fluids for dehydration. He was maintained on oxygen and BiPAP, as per his home regimen. From a respiratory standpoint, the patient remained stable in the hospital. He underwent endoscopy which revealed candida esophagitis as well as duodenitis and a duodenal ulcer. He was treated appropriately for his GI issues by Dr. Mechele CollinElliott. His nausea and vomiting resolved  with the Zofran. He was maintained on a proton pump inhibitor. He was started on Diflucan for his Candida esophagitis. His diet was advanced slowly. He had no further nausea, vomiting, or diarrhea in the hospital. He denied abdominal pain. He is tolerating his diet well. By 12/04/2011, the patient was stable and ready for discharge.   DISCHARGE DIAGNOSES:  1. Intractable nausea and vomiting.  2. Dehydration.  3. Duodenitis.  4. Duodenal ulcer.  5. Candida esophagitis.  6. Chronic respiratory failure, on oxygen.  7. Chronic obstructive pulmonary disease.  8. Pulmonary hypertension.  9. Chronic systolic congestive heart failure.   DISCHARGE MEDICATIONS:  1. DuoNeb SVNs three times daily.  2. Advair 500/50 one puff twice a day.  3. Spiriva one capsule inhaled daily.  4. Lasix 40 mg p.o. twice a day. 5. Imdur 30 mg p.o. daily.  6. Sulfasalazine 500 mg 2 tablets p.o. twice a day. 7. Klor-Con 20 mEq p.o. twice a day. 8. Aspirin 81 mg p.o. at bedtime.  9. Theophylline 100 mg p.o. twice a day.  10. Senna Plus two tablets p.o. twice a day.  11. Carafate 1 gram p.o. three times daily.  12. Nystatin mouthwash 5 mL p.o. every six hours.  13. Coreg 6.25 mg p.o. twice a day.  14. Reglan 10 mg p.o. before meals and at bedtime.  15. Protonix 40 mg p.o. twice a day.  16. Zofran 4 mg p.o. every six hours.  17. Diflucan 100 mg p.o. daily for 10 days.  18. Oxygen at 2 liters per minute per nasal cannula.   FOLLOW-UP PLANS AND APPOINTMENTS: The patient was discharged home with oxygen. He is to  continue BiPAP at night. He will be followed by home health. He is on a low residue diet. He will follow-up with me in the office within 1 to 2 weeks, sooner if needed.  ____________________________ Duane Lope Judithann Sheen, MD jds:slb D: 12/11/2011 16:37:00 ET T: 12/12/2011 11:47:43 ET JOB#: [id number]  cc: Duane Lope. Judithann Sheen, MD, <Dictator> Azariah Latendresse Rodena Medin MD ELECTRONICALLY SIGNED 12/12/2011 16:44

## 2014-06-02 NOTE — Consult Note (Signed)
Chief Complaint:   Subjective/Chief Complaint Covering for Dr. Vira Agar. Wants to go home. Tolerating liquids. No nausea or abd pain. Breathing ok.   VITAL SIGNS/ANCILLARY NOTES: **Vital Signs.:   19-Oct-13 08:30   Vital Signs Type Pre Medication   Temperature Temperature (F) 98.1   Celsius 36.7   Pulse Pulse 91   Respirations Respirations 20   Systolic BP Systolic BP 060   Diastolic BP (mmHg) Diastolic BP (mmHg) 72   Mean BP 88   Pulse Ox % Pulse Ox % 96   Pulse Ox Activity Level  At rest   Oxygen Delivery 2L   Brief Assessment:   Cardiac Regular    Respiratory clear BS    Gastrointestinal Normal   Lab Results: Hepatic:  19-Oct-13 03:13    Bilirubin, Total  1.7   Alkaline Phosphatase 76   SGPT (ALT) 27   SGOT (AST) 26   Total Protein, Serum  6.1   Albumin, Serum  3.2  Routine Chem:  19-Oct-13 03:13    Glucose, Serum 76   BUN  6   Creatinine (comp) 0.83   Sodium, Serum 140   Potassium, Serum  3.4   Chloride, Serum 102   CO2, Serum 30   Calcium (Total), Serum 8.5   Osmolality (calc) 276   eGFR (African American) >60   eGFR (Non-African American) >60 (eGFR values <29m/min/1.73 m2 may be an indication of chronic kidney disease (CKD). Calculated eGFR is useful in patients with stable renal function. The eGFR calculation will not be reliable in acutely ill patients when serum creatinine is changing rapidly. It is not useful in  patients on dialysis. The eGFR calculation may not be applicable to patients at the low and high extremes of body sizes, pregnant women, and vegetarians.)   Anion Gap 8   Assessment/Plan:  Assessment/Plan:   Assessment duodenal ulcer. No nausea or vomiting now.    Plan Try low residue diet. Continue daily PPI.   Electronic Signatures: OVerdie Shire(MD)  (Signed 19-Oct-13 10:52)  Authored: Chief Complaint, VITAL SIGNS/ANCILLARY NOTES, Brief Assessment, Lab Results, Assessment/Plan   Last Updated: 19-Oct-13 10:52 by OVerdie Shire(MD)

## 2014-06-02 NOTE — Consult Note (Signed)
Chief Complaint:   Subjective/Chief Complaint Wants to go home. Tolerating solids without nausea or abd pain.   VITAL SIGNS/ANCILLARY NOTES: **Vital Signs.:   20-Oct-13 08:32   Vital Signs Type Q 4hr   Temperature Temperature (F) 98.3   Celsius 36.8   Pulse Pulse 100   Respirations Respirations 20   Systolic BP Systolic BP 373   Diastolic BP (mmHg) Diastolic BP (mmHg) 97   Mean BP 118   Pulse Ox % Pulse Ox % 91   Pulse Ox Activity Level  With exertion   Oxygen Delivery 2L   Brief Assessment:   Cardiac Regular    Respiratory normal resp effort    Gastrointestinal Normal   Lab Results: Hepatic:  20-Oct-13 05:00    Bilirubin, Total  1.4   Alkaline Phosphatase 70   SGPT (ALT) 23   SGOT (AST) 24   Total Protein, Serum  5.9   Albumin, Serum  3.2  Routine Chem:  20-Oct-13 05:00    Glucose, Serum 77   BUN  5   Creatinine (comp) 0.83   Sodium, Serum 141   Potassium, Serum 3.9   Chloride, Serum 103   CO2, Serum 31   Calcium (Total), Serum 8.9   Osmolality (calc) 277   eGFR (African American) >60   eGFR (Non-African American) >60 (eGFR values <50m/min/1.73 m2 may be an indication of chronic kidney disease (CKD). Calculated eGFR is useful in patients with stable renal function. The eGFR calculation will not be reliable in acutely ill patients when serum creatinine is changing rapidly. It is not useful in  patients on dialysis. The eGFR calculation may not be applicable to patients at the low and high extremes of body sizes, pregnant women, and vegetarians.)   Anion Gap 7   Lipase 114 (Result(s) reported on 03 Dec 2011 at 06:31AM.)  Routine Hem:  20-Oct-13 05:00    WBC (CBC) 5.4   RBC (CBC) 4.94   Hemoglobin (CBC) 14.8   Hematocrit (CBC) 44.8   Platelet Count (CBC) 168   MCV 91   MCH 30.0   MCHC 33.1   RDW  17.1   Neutrophil % 59.9   Lymphocyte % 22.4   Monocyte % 14.4   Eosinophil % 1.8   Basophil % 1.5   Neutrophil # 3.3   Lymphocyte # 1.2   Monocyte  # 0.8   Eosinophil # 0.1   Basophil # 0.1 (Result(s) reported on 03 Dec 2011 at 06:28AM.)   Assessment/Plan:  Assessment/Plan:   Assessment Nausea. Duodenal ulcer.    Plan Being switched to oral meds. If stable, agree with discharge tomorrow AM. If sxs recur, then Dr. EVira Agarwill see patient tomorrow. Thanks.   Electronic Signatures: OVerdie Shire(MD)  (Signed 20-Oct-13 10:15)  Authored: Chief Complaint, VITAL SIGNS/ANCILLARY NOTES, Brief Assessment, Lab Results, Assessment/Plan   Last Updated: 20-Oct-13 10:15 by OVerdie Shire(MD)

## 2014-06-02 NOTE — Consult Note (Signed)
PATIENT NAME:  Andrew [name], Andrew [name] MR#:  [id number] DATE OF BIRTH:  1930-08-15  DATE OF CONSULTATION:  11/14/2011  REFERRING PHYSICIAN:  Aram BeechamJeffrey Sparks, MD  CONSULTING PHYSICIAN:  Hemang K. Sherryll BurgerShah, MD   SURGEON: Dr. Katrinka BlazingSmith   REASON FOR CONSULTATION: Altered mental status and decreased loss of alertness.    HISTORY OF PRESENT ILLNESS: Mr. Andrew MessingHowe is a 81 year old Caucasian gentleman who was admitted on Friday, 11/10/2011, for inguinal hernia surgery. Postop the patient stayed in the hospital due to developing of a huge hematoma in his left inguinal region which has spread up to his right inguinal region and posterior aspect.   The patient was given pain medication as well.   The patient also has a known history of chronic obstructive pulmonary disease and obstructive sleep apnea. At home he is on 2 liters of oxygen but here in the hospital due to his hypoxia he has been given increased level of O2 per nurse report.   The patient is a known CO2 retainer.   The patient also has a history of rheumatoid arthritis. He was given prednisone in the past but he is noncompliant.   Cognitively he does have some cognitive impairment at baseline but he is very pleasant and can do his activities of daily living well. The patient's wife mentioned that the daughter will be the right person to answer those type of questions.   PAST MEDICAL HISTORY:  1. Chronic obstructive pulmonary disease. 2. Possible sleep apnea. 3. Allergic rhinitis.  4. Colon tubular adenoma.  5. History of stroke. 6. Hypertension. 7. Rheumatoid arthritis. 8. Osteoarthritis. 9. Erectile dysfunction  10. Obesity.   PAST SURGICAL HISTORY:  1. Left inguinal hernia repair in 2009. 2. Multiple colon polyps removed in 2008.   FAMILY HISTORY: Significant that there is breast cancer. No colon cancer. No diabetes. No coronary artery disease.   SOCIAL HISTORY: Significant that he quit smoking in 1998. He is retired. He is married. He does not  use alcohol.   MEDICATIONS: I reviewed his home medication list.   PHYSICAL EXAMINATION:   VITAL SIGNS: Temperature 98.4, pulse 96, respiratory rate 20, blood pressure 112/68, pulse oximetry 90% on 3 liters of oxygen.   GENERAL: He is an elderly looking Caucasian gentleman standing next to his bedside to go pee. After I started evaluation with him, her preferred to stand for a long period of time.   He does have forward flexion of his neck. He has a huge ecchymosis extending from his left flank region to his right inguinal region. He has extensive varicose vein in his lower extremity as well.   He has artificial lenses in both eyes.   He has multiple skin lesions.   LUNGS: Clear to auscultation.   HEART: S1, S2 heart sounds. Carotid exam did not reveal any bruit.   NEUROLOGIC: He was alert. He was a little bit irritable. He did not seem to be interested in talking to me. He mentioned he has been seen by 5 to 6 different doctors and that he prefers not to talk to other doctors.   The patient gave very short answers to multiple questions but does seem like he knew that today is 11/14/2011. He followed two-step inverted commands. He did not seem to have neurological neglect. He had a good attention span. He kept saying he has "cabin fever".   On his cranial nerves, his pupils are reactive. Extraocular movements are intact. Visual field was not checked due to the patient's poor  cooperation. His face was symmetric. His tongue was midline. His hearing seems to be decreased bilaterally.   On his motor exam, he seems to have symmetric strength in his upper and lower extremity but he has significant restriction of motion in his right shoulder.   His reflexes were symmetrically reduced. His sensations were intact to light touch. His gait was not checked but his standing was okay. He had significant difficulty sitting back down.   ASSESSMENT AND PLAN: Toxic metabolic encephalopathy, likely  multifactorial. He has decreased level of alertness which can be seen as a result of hypercarbia.   The patient has known COPD and he is a CO2 retainer per family.   He cannot tolerate higher dose of oxygen. The patient has been consulted for pulmonary management of COPD by Dr. Welton Flakes who is his outpatient pulmonologist.   The patient also was given pain medications and scopolamine patch. Scopolamine patch can cause confusion in the elderly population.   The patient's pain itself can cause confusion.   The patient also has some leukocytosis and resolving mild hyponatremia.   I would like to obtain some basic blood work such as TSH and Vitamin B12. I have low suspicion that he has organic brain lesion related change in mental status. I did not see any asterixis.   I would like to obtain a CT scan of the head but I have low suspicision of organic brain lesion causing his symptoms.   The daughter was not available to discuss his case at the present who had questions about his altered mental status.   I would like to obtain the history about his previous cognitive status which might clarify the situation that patients with mild dementia can have delirium during prolonged hospital stay.   I talked to the nurse about reorienting him frequently, keeping him safe, avoiding excessive medication, etc. I will see him in follow-up. Feel free to contact me with any further questions.   ____________________________ Durene Cal. Sherryll Burger, MD hks:drc D: 11/14/2011 18:10:31 ET T: 11/15/2011 08:33:10 ET JOB#: [id number]  cc: Hemang K. Sherryll Burger, MD, <Dictator> Durene Cal [facility] MD ELECTRONICALLY SIGNED 11/16/2011 17:23

## 2014-06-02 NOTE — Consult Note (Signed)
PATIENT NAME:  Andrew [name], DYMEK MR#:  [id number] DATE OF BIRTH:  17-Dec-1930  DATE OF CONSULTATION:  11/12/2011  REFERRING PHYSICIAN:   CONSULTING PHYSICIAN:  Ocie Cornfield. Ouida Sills, MD  REASON FOR CONSULTATION: COPD, nausea.  HISTORY OF PRESENT ILLNESS: Mr. Felten is a pleasant 81 year old male with history of COPD, hypertension, and rheumatoid arthritis. He had an inguinal hernia repair done three days ago by Dr. Rochel Brome. He has had nausea and a large hematoma since then. Dr. Bary Castilla has been following him over the weekend and asked for me to evaluate as well to make sure there are no further issues to be addressed presently. Other than the nausea and appropriate abdominal pain, he is without complaints. He is unable to eat now. He has history of coronary artery disease and likely cardiomyopathy as noted as well. He is getting oxycodone and acetaminophen p.r.n. for pain. His sodium was low today. He is post x-rays of his abdomen this morning and the results are not back as of yet.   REVIEW OF SYSTEMS: Completed and negative other than above.   PAST MEDICAL HISTORY:  1. Chronic obstructive pulmonary disease.  2. Sleep apnea. 3. Allergic rhinitis.  4. History of stroke.  5. Hypertension.  6. Rheumatoid arthritis with history of being on sulfasalazine and prednisone.  7. Osteoarthritis.  8. Coronary artery disease.  9. Obesity.   PAST SURGICAL HISTORY:  1. History of left inguinal repair in 2009 and repeat now.  2. Multiple colon polypectomies by Dr. Vira Agar.   ALLERGIES: Ativan, Klonopin, penicillin.   MEDICATIONS: Reviewed. Medicines as ordered here. Notably he is not on Coreg now given some hypotension postop and his usual nebs have been p.r.n. though normally standing.   SOCIAL HISTORY: He quit smoking in 1998. He is retired. He is married. He has no alcohol.   FAMILY HISTORY: Breast cancer. No colon cancer. No diabetes or coronary artery disease noted.   PHYSICAL EXAMINATION:    GENERAL: Pleasant male lying in bed, nausea, mild distress secondary to that. No clear pain.   VITAL SIGNS: Blood pressure 118/72 on last check, pulse 102, temperature 97.3. He is on 3 liters nasal cannula O2.   HEENT: Conjunctivae normal. Extraocular muscles intact.   NECK: No bruits, thyromegaly, adenopathy, or JVD.   CHEST: Diminished but clear.   HEART: Regular rate and rhythm without murmurs or gallops. Peripheral pulses decreased.   ABDOMEN: Soft, flat. Very large hematoma on the left lower quadrant and less right lower quadrant, appropriately tender otherwise. Quiet bowel sounds.   EXTREMITIES: No clubbing, cyanosis, or edema.   NEUROLOGIC: Nonfocal.   LABORATORY, DIAGNOSTIC, AND RADIOLOGICAL DATA: Sodium 127, glucose 102. Fairly unremarkable Met-B otherwise.   ASSESSMENT AND PLAN:  1. Chronic obstructive pulmonary disease, severe, on oxygen, postop relatively stable. Will make his nebs standing. Ensure he is not hypoxic. Followed by pulse oximetry for now.  2. Nausea. Postop complication versus opioid-induced versus etc. Will put a scopolamine patch on him now as it will be the best treatment for the opioid-induced nausea. P.r.n. Zofran as well. Will increase the dose. X-rays will be followed up by Surgery.  3. Coronary artery disease, stable, off of Coreg. Will restart when it's clear his blood pressure will tolerate it.  4. Postop inguinal hernia repair. Large hematoma. Unclear what if anything else is happening. No bowel movements yet. Dr. Bary Castilla is following closely.   Thank you very much for the consult.   ____________________________ Ocie Cornfield. Ouida Sills, MD mwa:drc D:  11/12/2011 11:14:38 ET T: 11/12/2011 13:48:05 ET JOB#: [id number]  cc: Ocie Cornfield. Ouida Sills, MD, <Dictator> Kirk Ruths MD ELECTRONICALLY SIGNED 11/13/2011 7:42

## 2014-06-02 NOTE — Op Note (Signed)
PATIENT NAME:  Andrew [name], [name] MR#:  [id number] DATE OF BIRTH:  [birth year]  DATE OF PROCEDURE:  11/10/2011  PREOPERATIVE DIAGNOSIS: Recurrent left inguinal hernia.   POSTOPERATIVE DIAGNOSIS: Recurrent sliding left inguinal hernia.   PROCEDURE: Left inguinal hernia repair.   SURGEON: Adella HareJ. Wilton Smith, MD  ANESTHESIA: Spinal.   INDICATIONS: This [age] year old male has had previous left inguinal hernia repair on two prior occasions; last time was 2009. Recently has had development of bulging in the left groin. He had a large palpable nonreducible mass which appeared to be chronically incarcerated and repair was recommended for definitive treatment.   DESCRIPTION OF PROCEDURE: The patient was placed on the operating table in the supine position under spinal anesthesia. The mass was palpable in the left groin. The left lower quadrant of the abdomen was prepared with clippers and with ChloraPrep, draped in a sterile manner.   An incision was made at the site of an old scar which was transversely oriented in the suprapubic area on the left side, carried down through subcutaneous tissues. Two traversing veins were divided between 3-0 Vicryl ligatures. Scarpa's fascia was incised. There was scar tissue found during the course of the dissection. Numerous small bleeding points were cauterized. The external oblique aponeurosis was incised along the course of its fibers to open the external ring and expose the hernia. There was a large sac which was dissected free from surrounding structures. There was much scar tissue found. The cord structures were dissected away from the sac. It appeared to be an indirect hernia and was found to be sliding and that large part of the wall hernia consisted of sigmoid colon. The dissection was carried out and followed this down through the internal ring and separated the sac circumferentially and then inverted the sac as it was reduced. Next, the muscle wall defect was further  demonstrated. It was approximately 3 cm in dimension. The repair was carried out with a row of 0 Surgilon sutures suturing the conjoined tendon to the shelving edge of the inguinal ligament. A relaxing incision was made medially in the fascia of the internal oblique. Also there was some weakness of the abdominal wall just cephalad to the internal ring which was imbricated with 0 Surgilon. Next, an onlay Atrium mesh was placed. This was cut to create an oval shape of some 3 x 5 cm and cut out a notch to straddle the cord structures around the internal ring. This was attached to the deep fascia and to the repair and to the medial edge of the relaxing incision. The repair looked good. Hemostasis was intact. The cut edges of the external oblique aponeurosis were closed over the cord structures to recreate the external ring. This was done with 3-0 Vicryl running suture. Next, the Scarpa's fascia was closed with 3-0 Vicryl interrupted sutures. The skin was closed with a running 4-0 Monocryl subcuticular suture and Dermabond. The testicle remained in the scrotum. The patient tolerated this satisfactorily and was then prepared for transfer to the recovery room.    ____________________________ Shela CommonsJ. Renda RollsWilton Smith, MD jws:cms D: 11/10/2011 11:43:04 ET T: 11/10/2011 12:35:31 ET JOB#: [id number]  cc: Adella HareJ. Wilton Smith, MD, <Dictator> Adella HareWILTON J SMITH MD ELECTRONICALLY SIGNED 11/11/2011 18:27

## 2014-06-02 NOTE — Consult Note (Signed)
Brief Consult Note: Diagnosis: hypoxia / hypercarbia related encephalopathy, decreased level of alertness.   Patient was seen by consultant.   Consult note dictated.   Comments: - multifactorial encephalopathy, - CT head, TSH, Vit B12 -ordered. - hold off EEG. - will need to assess previous congnitive status - when daughter available. - will follow.  Electronic Signatures: Jolene ProvostShah, Hemang Kalpeshkumar (MD)  (Signed 01-Oct-13 18:13)  Authored: Brief Consult Note   Last Updated: 01-Oct-13 18:13 by Jolene ProvostShah, Hemang Kalpeshkumar (MD)

## 2014-06-02 NOTE — H&P (Signed)
PATIENT NAME:  Andrew [name], Andrew [name] MR#:  [id number] DATE OF BIRTH:  08/01/1930  DATE OF ADMISSION:  12/07/2011  HISTORY OF PRESENT ILLNESS: This 81 year old male comes in with chief complaint of vomiting. He reports that he just recently went home from the hospital and since being discharged has vomited about 4 times per day. He feels like he is unable to keep down a substantial amount of liquids or food, sometimes has some dry heaves. He has been taking Zofran from time to time but that does not seem to help with the nausea and vomiting. He reports no heartburn, no hematemesis. He reports no abdominal pain. He was in the Emergency Room last night and he had not moved his bowels for several days and was given an enema and was later back home and had a large bowel movement. He has not passed any blood. He reports he has had some alternating cold and hot sensations but no actual documented fever.  It is further noted he had an x-ray of the abdomen last night which showed a fair amount of stool in the colon but no evidence of obstruction. Also, he has known gallstones and did have an ultrasound last night which demonstrated gallstones but there was no thickening of the gallbladder wall.   PAST MEDICAL HISTORY:  1. Chronic obstructive pulmonary disease with CO2 retention, on chronic therapy. 2. History of sleep apnea. Has been prescribed BiPAP but in recent days has not been using his BiPAP. He does continue with oxygen therapy. 3. History of inflammatory arthritis.   4. Osteoarthritis.  5. Polycythemia with multiple phlebotomies.  6. He has recently had repair of a recurrent left inguinal hernia which was done in September. This was complicated by bruising and hematoma and later did evacuate a hematoma.   7. Recent readmission due to vomiting. Dr. Mechele Collin did an upper endoscopy. He did find evidence of monilial esophagitis, typical stomach. There was a duodenal ulcer with a clean base and was started on  Protonix.   MEDICATIONS:  1. Advair 500/50 b.i.d.  2. Coreg 6.75 mg t.i.d.  3. Diflucan 100 mg daily. 4. DuoNeb nebulizer t.i.d.  5. Furosemide 40 mg b.i.d.  6. Imdur 30 mg daily.  7. Klor-Con 20 mEq b.i.d.  8. Nystatin swallow. 9. Protonix 40 mg daily.  10. Reglan 10 mg q.i.d.  11. Senna 2 tablets b.i.d.  12. Spiriva one dose daily.   ALLERGIES: Ativan, Biaxin, Klonopin, penicillin.   SOCIAL HISTORY: Has smoked in the past but quit smoking in 1998. Does not drink any alcohol.   FAMILY HISTORY: Positive for breast cancer.  REVIEW OF SYSTEMS: He reports that he continues to use his oxygen at approximately 2 L/min. Has not been using his BiPAP. Does report frequent productive cough of thin white sputum. Reports no other particular cold or sore throat. Reports no recent visual changes. May have some difficulty hearing. He reports no vertigo. No dizziness. He reports no chest pain. Not aware of any irregular heartbeat. Does require oxygen around-the-clock. Has not had any episodes of dyspnea in the last few days. He reports he's lost weight from 220 down to 192 over approximately the last five weeks. He reports has had frequent nausea and vomiting, little oral intake stays down. Has had chronic constipation. Continues Senna. Had an enema last night. He reports he is voiding satisfactorily. He reports limited amount of walking. He is using a walker at home walking to the bathroom and does do some walking inside  the house. EXTREMITIES: Has had some edema in the past but recently no significant edema while taking his Lasix. Does report some moderate degree of generalized weakness. Reports not having any pain anywhere. Says he had some recent swelling in the scrotum that has gone down. He is not having any scrotal pain.   PHYSICAL EXAMINATION:   GENERAL: He is awake, alert, and oriented resting in the hospital bed.   VITAL SIGNS: Temperature 97.2, pulse 77, respirations 20, blood pressure 128/77,  pulse oximetry 92%.   SKIN: Some ecchymosis which appears to be fading across the lower abdomen. His skin otherwise appears to be without jaundice.   HEENT: Pupils equal and reactive to light. Extraocular movements are intact. Sclerae appears slightly icteric. Pharynx is clear. He has dentures.   NECK: Very kyphotic. Marked limitation of extension. There is no palpable mass.   LUNGS: Lung sounds are clear.   HEART: Regular rhythm, S1, S2.   ABDOMEN: Mildly obese, soft and nontender. There is some firmness around his inguinal hernia repair site consistent with hematoma. His incision appears to be dry and intact.    EXTREMITIES: No dependent edema.   NEUROLOGIC: Awake, alert, oriented, moving all extremities.   CLINICAL DATA: He had lab work last night. Glucose 116, sodium 135, potassium 3.2. Liver panel with elevated total bilirubin at 1.5. CBC with white blood count 9400, hemoglobin 15.5. His albumin is 3.7.   I reviewed his ultrasound which demonstrated gallstones. There is no gallbladder wall thickening. Bile duct normal caliber. Partially visualized pancreas normal.   IMPRESSION:  1. Protracted vomiting.  2. Duodenal ulcer. 3. Cholelithiasis.  4. Abdominal wall hematoma.  5. End-stage chronic obstructive pulmonary disease   I discussed this with him and recommended admission to the hospital, IV fluids, intravenous Reglan, intravenous Protonix. Recommended upper GI with small bowel x-ray series which we will do tomorrow. Will have him take clear liquids and Ensure. Give some time for observation.   At this point I do not think this is clearly his gallbladder. He would have significant risks as far as having general anesthesia for laparoscopic cholecystectomy. It is unclear whether that's actually the cause of his nausea. Will try using Phenergan to see if it is more helpful in controlling his nausea. May need further consultation with Gastroenterology and Pulmonary medicine.    ____________________________ J. Renda RollsWilton Smith, MD jws:drc D: 12/07/2011 18:59:50 ET T: 12/08/2011 06:23:15 ET JOB#: [id number]  cc: Adella HareJ. Wilton Smith, MD, <Dictator> Duane LopeJeffrey D. Judithann SheenSparks, MD Adella HareWILTON J SMITH MD ELECTRONICALLY SIGNED 12/08/2011 9:24

## 2014-06-02 NOTE — H&P (Signed)
PATIENT NAME:  Andrew [name], Andrew [name] MR#:  [id number] DATE OF BIRTH:  07-26-1930  DATE OF ADMISSION:  11/30/2011  ED REFERRING PHYSICIAN: Dr.  Governor Rooks PRIMARY CARE PHYSICIAN:  Dr. Judithann Sheen   CHIEF COMPLAINT: Nausea and vomiting.   HISTORY OF PRESENT ILLNESS: The patient is a 81 year old white male with history of chronic obstructive pulmonary disease, hypertension, and rheumatoid arthritis, who was hospitalized here for approximately 14 days recently after he initially was admitted on 09/27 and had a left inguinal hernia repair. The patient's hospital course was complicated by altered mental status and chronic obstructive pulmonary disease exacerbation. The patient also continued throughout the hospitalization to have nausea. The patient also subsequently was noted to have evidence of abdominal wall hematoma. On 10/08 he was taken back to the operating room and had this evacuated. The patient was subsequently discharged home. According to his daughters, he was still a little nauseous but was doing okay on the day of discharge, but as soon as he got home every time he started eating or drinking water he would be very nauseous and would throw up. This has persisted and has continued to get worse. Due to these symptoms, he comes to the Emergency Department.  He otherwise denies any fevers or chills, denies any abdominal pain. He was given two enemas yesterday times two with just a little bit of liquid stools. He otherwise has not had any fevers or chills. No chest pains or shortness of breath. He has not had any hematemesis or hematochezia.   PAST MEDICAL HISTORY: 1. Chronic obstructive pulmonary disease.  2. History of sleep apnea. He uses BiPAP at bedtime. 3. Allergic rhinitis. 4. Rheumatoid arthritis with history of being on sulfasalazine and was previously on prednisone.  5. Osteoarthritis.  6. History of congestive heart failure per family.  PAST SURGICAL HISTORY:  1. Status post left inguinal hernia  repair times three according to the family.  2. Multiple polypectomies by Dr. Mechele Collin in the past.   ALLERGIES: Ativan, Klonopin, and penicillin.   SOCIAL HISTORY: He quit smoking in 1998. He is retired. He has no history of alcohol abuse or drug use.   FAMILY HISTORY: History of breast cancer. There is no colon cancer. No diabetes or hypertension in the family.   HOME MEDICATIONS: 1. Advair 500/50 INH daily.  2. Aspirin 81 mg 1 tab p.o. at bedtime.  3. Carvedilol 3.125, 1 tab p.o. b.i.d.  4. DuoNebs 3 times per day. 5. Lasix 40 mg 1 tab p.o. b.i.d.  6. Imdur 30 mg daily.  7. Klor-Con 20 mEq q. 12 hours.  8. Mucinex 600, 1 tab p.o. daily p.r.n.    9. Senna 2 tabs 2 times per day.  10. Spiriva 18 mcg daily.  11. Sulfasalazine 1000 mg, 1 tab p.o. q. 12. 12. Theophylline 100, 1 tab p.o. b.i.d.  13. Tylenol Extra Strength 500 mg q. 6 p.r.n. for pain.   REVIEW OF SYSTEMS:  CONSTITUTIONAL: Denies any fevers. States that he has felt hot and cold. Complains of fatigue and weakness. No significant pain. Complains of some weight loss, not sure how much. Denies any weight gain. EYES: No blurred or double vision. No pain. No redness. No inflammation. ENT: Denies any tinnitus. No ear pain. No hearing loss. Does have allergies. No epistaxis. No difficulty with swallowing. RESPIRATORY: No cough. No wheezing. No hemoptysis. Has chronic obstructive pulmonary disease, is on oxygen all the time, uses BiPAP at nighttime. CARDIOVASCULAR: Denies any chest pain, orthopnea, edema, or  arrhythmia. No syncope. GI: Complains of nausea and vomiting. No diarrhea. No abdominal pain. No hematemesis. No melena. No gastroesophageal reflux disease. No irritable bowel syndrome. No jaundice. No rectal bleeding. GU: Denies any dysuria, hematuria, renal calculus,  or frequency. ENDO: Denies any polyuria, nocturia, or  thyroid problems. HEME/LYMPH: Denies anemia. Denies easy bruising or bleeding. SKIN: Has a persistent large area  of bruising on his abdomen as well as lower abdomen as well as area of the left inguinal repair.  MUSCULOSKELETAL: Has rheumatoid arthritis and has pain related to that. No current swelling or history of gout. NEUROLOGIC:  No history of seizures or transient ischemic attack.  PSYCHIATRIC: No anxiety. No insomnia. No ADD. No OCD.   PHYSICAL EXAMINATION:  VITAL SIGNS: Temperature 98.4, pulse 91, respirations 20, blood pressure 105/63, O2 93% on 2 liters.   GENERAL: The patient is an elderly, chronically debilitated appearing male, currently not in any acute distress.   HEENT: Pupils are equal, round, reactive to light and accommodation. There is no conjunctival pallor. No scleral icterus. Nasal exam shows no drainage or ulceration. Oropharynx is clear without any exudates.   NECK: No thyromegaly. No carotid bruits.   CARDIOVASCULAR: Regular rate and rhythm. No murmurs, rubs, clicks, or gallops. PMI is not displaced.   LUNGS: Clear to auscultation bilaterally without any rales, rhonchi, or wheezing.   ABDOMEN: Soft, nondistended. He has a left inguinal area of large purplish discoloration as well as lower abdomen has purplish discoloration. Bowel sounds diminished. There is no guarding or rebound or tenderness.   SKIN: Bruising as noted as above. No other lesions.   VASCULAR: Good DP, PT pulses.   NEUROLOGICAL: Awake, alert, oriented times three. No focal deficits.   LYMPHATICS: No lymph nodes palpable.   VASCULAR: Good DP, PT pulses.   PSYCHIATRIC: Not anxious or depressed.   LABORATORY, DIAGNOSTIC, AND RADIOLOGICAL DATA: In the ED glucose 94, BUN 8, creatinine 0.84, sodium 131, potassium 3, chloride 87, CO2 33. His calcium is 9.7, lipase 88. LFTs are normal except bilirubin total 1.9. Troponin less than 0.02. WBC 7, hemoglobin 17, platelet count 256. Urinalysis is nitrite negative, leukocytes negative.   ASSESSMENT AND PLAN: The patient is a 81 year old white male with multiple  medical problems who was recently hospitalized for inguinal hernia surgery complicated by hematoma requiring evacuation. He has been having nausea and vomiting, unable to keep anything down, has progressive weakness.  1. Nausea, vomiting: Possibly related to recent surgery, also could be gastritis. There could be other causes of his symptoms. At this time we will place him under observation,  place him on IV Protonix b.i.d., Carafate before meals. I will place him on scheduled Reglan. We will have GI evaluate the patient as well as surgical evaluation.  2. Dehydration: We will give him low dose IV fluids with his history of congestive heart failure.   3. Chronic obstructive pulmonary disease: We will continue inhalers and nebulizers, BiPAP as doing previously at home at nighttime, and oxygen as doing at home.  4. Hypokalemia: We will replace IV since he was not able to keep to down the oral replacement that was given earlier.  5. History of congestive heart failure: We will give him cautious fluids. Follow his ins and outs and his respiratory status.  6. Inguinal hematoma: Surgical evaluation. We will hold aspirin as well as any other anticoagulants.    TIME SPENT: 35 minutes.  ____________________________ Lacie ScottsShreyang H. Allena KatzPatel, MD shp:bjt D: 11/30/2011 13:39:48 ET T: 11/30/2011  14:12:04 ET JOB#: [id number]  cc: Ashana Tullo H. Allena Katz, MD, <Dictator> Duane Lope. Judithann Sheen, MD Charise Carwin MD ELECTRONICALLY SIGNED 12/01/2011 19:22

## 2014-06-02 NOTE — Discharge Summary (Signed)
PATIENT NAME:  Andrew [name], Andrew [name] MR#:  [id number] DATE OF BIRTH:  March 08, 1930  DATE OF ADMISSION:  11/10/2011 DATE OF DISCHARGE:  11/24/2011  HISTORY OF PRESENT ILLNESS: This 81 year old male came in through the outpatient surgery department with a bulge in the left groin. He had been seen in the office and evaluated and found to have a large left inguinal hernia.   OTHER PAST MEDICAL HISTORY:  1. Chronic obstructive pulmonary disease with chronic oxygen therapy. 2. Sleep apnea, does use CPAP machine, does use oxygen around-the-clock. 3. History of congestive heart failure. 4. Hypertension.  5. Rheumatoid arthritis.  6. Osteoarthritis.  7. Obesity.  8. Polycythemia.   PAST SURGICAL HISTORY: Left inguinal hernia repair in 2009.    Details of his medicines and physical findings are recorded on the typed H and P.   RECENT LAB WORK: Hemoglobin 17.1. Basic metabolic panel with a creatinine of 0.8.   HOSPITAL COURSE: He was carried to the operating room where he had open left inguinal hernia repair. He was kept overnight for a period of observation and did develop a large amount of bruising and was kept in the hospital for an additional period of observation and during the course of his hospital stay he did have some problems with dyspnea requiring an increased amount of oxygen. We did also treat him with BiPAP machine which he tolerated poorly. He had markedly decreased activity tolerance and we did consult physical therapy to try to get him up walking. He did have problems with constipation, treated with enemas and improved. He also had some periods of altered mental status. This may have been related to use of narcotics. Dr. Sherryll BurgerShah consulted from Neurology and felt that he had multifactorial encephalopathy. A CT scan was done and showed some evidence of small vessel disease but no evidence of stroke or bleed and his mental status changes did improve with time.   With respect to respiratory  difficulties, we did consult Dr. Welton FlakesKhan in Pulmonary Medicine. The patient did have some respiratory distress and was moved for a brief period of time in the Critical Care Unit where he was given time for observation, treated with Spiriva, DuoNeb, and also Dr. Welton FlakesKhan added theophylline. The patient gradually improved.   He was kept in the hospital for a number of additional days for observation. On the 7th he had some drainage from his wound and he was carried to the operating room on the 8th where he had evacuation of hematoma and did insert a Blake drain which was removed prior to discharge. He did have about 500 mL of hematoma evacuated. He did have some problems with heaviness and aching in the scrotum which was treated with elevation of the scrotum and edema in the scrotum gradually improved.   FINAL DIAGNOSES:  1. Recurrent left inguinal hernia.  2. Mild thrombocytopenia.  3. Abdominal wall hematoma.  4. Chronic obstructive pulmonary disease with carbon dioxide retention and chronic oxygen dependency.  DISPOSITION: He was discharged with plans for returning home under the care of Hospice and plans made for follow-up in the office.   ____________________________ J. Renda RollsWilton Melissa Pulido, MD jws:drc D: 12/06/2011 11:12:33 ET T: 12/06/2011 11:30:06 ET JOB#: [id number]  cc: Adella HareJ. Wilton Chasyn Cinque, MD, <Dictator> Adella HareWILTON J Therisa Mennella MD ELECTRONICALLY SIGNED 12/08/2011 9:23

## 2014-06-02 NOTE — Op Note (Signed)
PATIENT NAME:  Andrew [name], Andrew [name] MR#:  [id number] DATE OF BIRTH:  08-14-1930  DATE OF PROCEDURE:  11/21/2011  PREOPERATIVE DIAGNOSIS: Abdominal wall hematoma.  POSTOPERATIVE DIAGNOSIS: Abdominal wall hematoma.  PROCEDURE: Incision and drainage of abdominal wall hematoma.   SURGEON: Adella HareJ. Wilton Cassy Sprowl, MD   ANESTHESIA: Local 1% Xylocaine with epinephrine with monitored anesthesia care.   INDICATIONS: This 81 year old male recently had a left inguinal hernia repair which was complicated by a large amount of bruising and swelling and yesterday began to have some drainage of dark, old appearing blood and appeared that he had a hematoma that was beginning to erode out through the incision and evacuation of the hematoma was recommended for definitive treatment.   DESCRIPTION OF PROCEDURE: The patient was placed on the operating table in the supine position and was sedated and monitored. The glue from the incision in the left lower quadrant was removed, however, some of the glue remained tenaciously intact and was left intact. The site was prepared with Betadine and draped in a sterile manner. The skin surrounding the wound was infiltrated with 1% Xylocaine with epinephrine and then a hemostat was introduced into the wound and spread and the entire wound was opened. Several bits of suture material were removed. There was a large hematoma which was evacuated with suction. The stitches in the Scarpa's fascia were divided and the hematoma was evacuated with a combination of digital manipulation and suction and drained approximately 500 mL of old dark blood. The wound was also gently debrided with gauze, however, it began to cause a small amount of bleeding and, therefore, was careful about using the gauze as an abrasive. Location of the deep fascia was determined. There was an extension of this hematoma which was laterally in the subcutaneous tissues. The wound was irrigated multiple times with warm saline solution  and aspirated. A 19 [place] Blake drain was used and was inserted and brought out through a more medially located stab wound and was sutured to the skin with 3-0 nylon. A portion of the Blake drain was cut off to make it fit and was placed into the subcutaneous tissues. Next, the wound was closed with interrupted 3-0 nylon vertical mattress sutures and the drain was activated with scant dark bloody drainage. Dressings were applied with paper tape.   The patient tolerated surgery satisfactorily and was then prepared for transfer to the recovery room.   ____________________________ Shela CommonsJ. Renda RollsWilton Korrine Sicard, MD jws:drc D: 11/21/2011 15:20:21 ET T: 11/21/2011 16:08:40 ET JOB#: [id number]  cc: Adella HareJ. Wilton Luciel Brickman, MD, <Dictator> Adella HareWILTON J Dhruva Orndoff MD ELECTRONICALLY SIGNED 11/23/2011 12:39

## 2014-06-02 NOTE — Consult Note (Signed)
PATIENT NAME:  Andrew [name], Andrew [name] MR#:  [id number] DATE OF BIRTH:  1930-11-25  DATE OF CONSULTATION:  11/30/2011  REFERRING PHYSICIAN:   CONSULTING PHYSICIAN:  Adella HareJ. Wilton Tisha Cline, MD  HISTORY OF PRESENT ILLNESS: This 81 year old male was admitted through the Emergency Room with a chief complaint of vomiting. He had recently had an open repair of a large left inguinal hernia which was done under spinal anesthesia. He had a borderline low platelet count and did develop large postoperative hematoma and extensive ecchymosis and did have evacuation of the hematoma in the operating room. He did have some episodes of nausea and vomiting while he was in the hospital. It appeared at that time it may be related to constipation and it appeared that his nausea and vomiting was relieved by enemas and bowel movements. However, since he has been recently discharged he reports he has had practically daily episodes of nausea and vomiting, limited nutrition, thinks he may have lost some weight. He had two enemas yesterday with some minimal bowel movement. He reports no chills or fever. Does have some chronic difficulty breathing and is on supplemental oxygen.   PAST MEDICAL HISTORY:  1. Significant chronic obstructive pulmonary disease with CO2 retention. 2. History of sleep apnea. Does use BiPAP at home.   3. History of inflammatory arthritis. 4. Osteoarthritis.  5. History of polycythemia. 6. History of phlebotomies.   MEDICATIONS:  1. Advair. 2. Aspirin. 3. Carvedilol.  4. DuoNebs. 5. Lasix. 6. Imdur.  7. Klor-Con.  8. Mucinex. 9. Senokot 2 tablets 2 times per day. 10. Spiriva. 11. Sulfasalazine. 12. Theophylline. 13. Tylenol.  REVIEW OF SYSTEMS: Reports he thinks his breathing is stable at present. He does continue 4 liters of oxygen per minute.   PHYSICAL EXAMINATION:   GENERAL: He was awake, alert, and resting in the hospital bed in no acute distress.   VITAL SIGNS: Temperature 97.7, pulse 85, blood  pressure 140/79.   He appears to be breathing easily with no respiratory distress. He does have decreasing amounts of ecchymosis of the lower abdomen, proximal thigh, and left flank. There is some firmness beneath his transversely oriented left suprapubic incision. There is also some firmness directly overlying the pubic tubercle. Testicles are descended. His incision appears to be dry and intact with sutures intact.   LABORATORY, DIAGNOSTIC, AND RADIOLOGICAL DATA: Clinical data reviewed. Sodium 131, potassium 3.0, chloride 87, bicarb 33, total bilirubin 1.9, white blood count 7000, hemoglobin 17.0.   I reviewed his plain films which showed no evidence of a bowel obstruction. I reviewed his CT images which did demonstrate some old hematoma in the left lower quadrant of the abdominal wall and in the suprapubic area. I do not see any actual hernia. There may be some small amounts of air in the tissues related to previous surgery.   IMPRESSION:  1. Nausea and vomiting possibly related to gastritis and/or peptic ulcer disease. Does not appear to be directly related to his surgery.  2. Postoperative hematoma.         PLAN: Consult Dr. Mechele CollinElliott. I have spoken to Dr. Mechele CollinElliott and just briefly reviewed history with him. Dr. Mechele CollinElliott will see him tonight. Consider doing endoscopy. I think that he could tolerate intravenous sedation and endoscopy, give IV fluids, potassium replacement, and intravenous Reglan. I agree with these plans.   ____________________________ Shela CommonsJ. Renda RollsWilton Maiya Kates, MD jws:drc D: 11/30/2011 18:45:33 ET T: 12/01/2011 10:18:30 ET JOB#: [id number]  cc: Adella HareJ. Wilton Won Kreuzer, MD, <Dictator> Adella HareWILTON J Lyzette Reinhardt MD ELECTRONICALLY  SIGNED 12/01/2011 18:54

## 2014-06-02 NOTE — Consult Note (Signed)
CC: vomiting. On EGD  He had candida esophagitis and a duodenal ulcer with duodenitis.  Will treat with Zofran q6hr around the clock for 48 hours to break vomiting cycle, treat the candida in esophagus, treat his ulcer with iv meds until we can be sure he can keep things down.  Needs to be admitted for continued treatment which is expected to take 3-4 days at least.  Will try full liquids after starting Zofran.    Electronic Signatures: Scot JunElliott, Nicco Reaume T (MD)  (Signed on 18-Oct-13 09:19)  Authored  Last Updated: 18-Oct-13 09:19 by Scot JunElliott, Emaley Applin T (MD)

## 2014-06-02 NOTE — Consult Note (Signed)
PATIENT NAME:  Andrew [name], Andrew [name] MR#:  [id number] DATE OF BIRTH:  1930-08-29  DATE OF CONSULTATION:  11/30/2011  REFERRING PHYSICIAN:   CONSULTING PHYSICIAN:  Scot Jun, MD  HISTORY OF PRESENT ILLNESS:  The patient is a 81 year old white male with a history of significant COPD, hypertension, and rheumatoid arthritis. The patient had a left inguinal hernia repair. He had bleeding afterwards and had significant abdominal wall hematoma. On 10/08 blood clots were removed and he was given a Wound VAC by Dr. Katrinka Blazing. The patient went home after this and he has been persistently nauseated since then. Every time he eats or drinks he would tend to throw up. Because of this he came to the ER. I was contacted by the ER physician and felt uncomfortable seeing the patient as an outpatient since he is throwing up every meal and recommended that he be admitted to the hospital for further evaluation.   PAST MEDICAL HISTORY:  1. Chronic obstructive pulmonary disease.  2. Sleep apnea with BiPAP.  3. Allergic rhinitis. 4. Rheumatoid arthritis.  5. Osteoarthritis.   6. Congestive heart failure.   PAST SURGICAL HISTORY: Multiple polypectomies in the past.   ALLERGIES: Ativan, Klonopin, and penicillin.   HABITS: He quit smoking in 1998. No history of alcohol use.   MEDICATIONS ON ADMISSION:  1. Advair 500/50 1 inhalation a day.  2. Aspirin 81 mg a day.  3. Carvedilol 3.125 mg b.i.d.  4. DuoNebs 3 times a day.  5. Lasix 40 mg 1 b.i.d.  6. Imdur 30 mg a day.  7. Potassium 20 mEq every 12 hours. 8. Mucinex 600 mg 1 tablet daily p.r.n.  9. Senokot 2 tablets 2 times a day.  10. Spiriva 18 mcg daily. 11. Sulfasalazine 1 gram q.12 hours.  12. Theophylline 100 mg b.i.d.  13. Tylenol p.r.n.   REVIEW OF SYSTEMS: The patient has had some degree of vomiting virtually after every meal, sometimes after drinking water. He denies coffee-ground vomiting or vomiting any blood. He denies any rectal bleeding. No  diarrhea. No constipation. No melena. No jaundice. The patient has bruising over the abdomen, the inguinal, area and the thigh.   PHYSICAL EXAMINATION:   GENERAL: Temperature 98.4, pulse 92, respirations 20, blood pressure 106/62.   GENERAL: Elderly white male in no acute distress. Family present for interview and the exam.   HEENT: Sclerae nonicteric. Conjunctivae negative. Head is atraumatic. Trachea is in the midline.   CHEST: Clear to auscultation.   HEART: No murmurs or gallops I can hear.   ABDOMEN: Soft, nondistended. Discoloration of the lower abdomen, some blood. Bowel sounds are present but somewhat diminished. No hepatosplenomegaly. No masses. No significant tenderness.   LABORATORY, DIAGNOSTIC, AND RADIOLOGICAL DATA: Glucose 94, BUN 8, creatinine 0.84, sodium 131, potassium 3, chloride 87, CO2 33, calcium 9.7. Lipase 88. Total bilirubin 1.9. Troponin negative. White count 7, hemoglobin 17, platelet count 256. Urinalysis negative.   A CAT scan of the abdomen was reported as negative.   ASSESSMENT AND PLAN: Vomiting with eating or drinking, unknown etiology.   PLAN: Do upper endoscopy tomorrow to rule organic causes for his vomiting. Will need to be very light with his anesthesia. If the upper endoscopy is unrevealing, then we will concentrate on antiemetic therapy. If ulcers, neoplasms, or other abnormalities are found in the stomach, duodenum or esophagus, or possible partial gastric outlet obstruction, then will handle it appropriately.  ____________________________ Scot Jun, MD rte:drc D: 11/30/2011 95:62:13 ET T: 12/01/2011 08:65:78  ET JOB#: [id number]  cc: Scot Junobert T. Burnham Trost, MD, <Dictator> J. Renda RollsWilton Smith, MD Scot JunOBERT T Jaedon Siler MD ELECTRONICALLY SIGNED 12/06/2011 8:06

## 2014-06-05 NOTE — Consult Note (Signed)
PATIENT NAME:  Andrew [name], [name] MR#:  [id number] DATE OF BIRTH:  01/19/1931  CARDIOLOGY CONSULTATION  DATE OF CONSULTATION:  12/10/2012  REFERRING PHYSICIAN:  Aram BeechamJeffrey Sparks, MD CONSULTING PHYSICIAN:  Marcina MillardAlexander Tysha Grismore, MD  REASON: Atrial fibrillation.   HISTORY OF PRESENT ILLNESS: The patient is an 81 year old gentleman with known history of COPD, obstructive sleep apnea, pulmonary hypertension, and chronic diastolic congestive heart failure. The patient is admitted with chief complaint of lethargy and generalized weakness, [facility] with right lower extremity erythema. Doppler ultrasound was negative for DVT, and the patient is admitted with cellulitis. The patient was noted to be in intermittent atrial fibrillation, was admitted to telemetry, and has now converted to sinus rhythm. The patient denies palpitations or heart racing.   PAST MEDICAL HISTORY:  1.  COPD on chronic O2 therapy at home.  2.  Sleep apnea, noncompliant with BiPAP.  3.  Pulmonary hypertension.  4.  Chronic diastolic congestive heart failure.  5.  Hypertension.  6.  History of CVA.  7.  Rheumatoid arthritis.   MEDICATIONS: Imdur 30 mg daily, carvedilol 3.125 mg b.i.d., furosemide 80 mg b.i.d., metolazone 5 mg daily, Klor-Con 20 mEq 4 times daily, Advair Diskus 500/50, one puff b.i.d.,  DuoNebs p.r.n., Spiriva 18 mcg inhalation daily, theophylline 100 mg b.i.d., Mucinex 600 mg b.i.d. p.r.n., senna tabs 2 b.i.d., Protonix 40 mg 4 times daily.   SOCIAL HISTORY: The patient quit tobacco abuse in 1998.   FAMILY HISTORY: No immediate family history of coronary artery disease or myocardial infarction.   REVIEW OF SYSTEMS: CONSTITUTIONAL: No fever or chills.  EYES: No blurry vision.  EARS: No hearing loss.  RESPIRATORY: Shortness of breath as described above.  CARDIOVASCULAR: Denies chest pain, palpitations, heart racing.  GASTROINTESTINAL: No nausea, vomiting, or diarrhea.  GENITOURINARY: No  dysuria or hematuria.  ENDOCRINE: No polyuria or polydipsia.  MUSCULOSKELETAL: No arthralgias or myalgias.  NEUROLOGICAL: No focal muscle weakness or numbness.  PSYCHOLOGICAL: No depression or anxiety.   PHYSICAL EXAMINATION:  VITAL SIGNS: Blood pressure 116/68, pulse 87, respirations 18, temperature 98.2, pulse oximetry 80%.  HEENT: Pupils equal and reactive to light and accommodation.  NECK: Supple without thyromegaly.  LUNGS: Decreased breath sounds in both bases.  CARDIOVASCULAR: Normal JVP. Normal PMI. Regular rate and rhythm. Normal S1, S2. No appreciable gallop, murmur, or rub.  ABDOMEN: Soft and nontender without hepatosplenomegaly.  EXTREMITIES: There was erythema in the right lower extremity.  MUSCULOSKELETAL: Normal muscle tone.  NEUROLOGIC: Alert and oriented. He was somewhat confused and somnolent. Motor and sensory were both grossly intact.   IMPRESSION: An 81 year old gentleman with known chronic obstructive pulmonary disease, on chronic O2 therapy, with chronic diastolic congestive heart failure, who presents with right lower extremity cellulitis with intermittent atrial fibrillation, asymptomatic, currently in sinus rhythm. Atrial fibrillation likely exacerbated by comorbidities. The patient has a CHADS2 score of 2 and potentially could be a candidate for chronic anticoagulation. At this point, atrial fibrillation likely secondary to cellulitis and would recommend aspirin therapy for stroke prevention.   RECOMMENDATIONS:  1.  Aspirin 81 mg daily.  2.  Would defer full-dose anticoagulation.  3.  At this time would defer chronic anticoagulation with warfarin or one of the new novel anticoagulants.  4.  A 2-D echocardiogram.    ____________________________ Marcina MillardAlexander Roslynn Holte, MD ap:np D: 12/10/2012 17:06:44 ET T: 12/10/2012 18:12:36 ET JOB#: [id number]  cc: Marcina MillardAlexander Jlynn Langille, MD, <Dictator> Marcina MillardALEXANDER Rmoni Keplinger MD ELECTRONICALLY SIGNED 01/01/2013 9:32

## 2014-06-05 NOTE — H&P (Signed)
PATIENT NAME:  Andrew [name] MR#:  [id number] DATE OF BIRTH:  02/12/1931  DATE OF ADMISSION:  12/10/2012  REFERRING PHYSICIAN: Dr. Kevin Paduchowski.   PRIMARY CARE PHYSICIAN: Dr. Jeffrey Sparks.   PRIMARY PULMONOLOGIST: Dr. Saadat Khan.    CARDIOLOGIST: Dr. Kowalski.   CHIEF COMPLAINT: Lethargy, weakness.   HISTORY OF PRESENT ILLNESS: This is an 82-year-old male with significant past medical history of COPD on 2 liters nasal cannula, hypertension, obstructive sleep apnea (supposed to be on a BiPAP but noncompliant), questionable history of congestive heart failure. The patient presents with complaints of weakness and lethargy and slight confusion as per daughters. When ask the patient why he is here, , he says "I don't know. I'm feeling fine," but the daughters reported basically they noticed when he was walking with a walker, he had an episode where he was more weak and lethargic than his baseline. Also, he appears to be more forgetful than he usually is. As well, the patient later mentioned that he was weak. The patient is known to have history of chronic respiratory failure due to COPD on 2 liters nasal cannula at baseline, where he appears to be stable from a respiratory point of view. No tachypnea. No hypoxia. The patient reports he has been having right lower extremity redness and erythema, where he saw his primary care physician and he was prescribed p.o. antibiotics for that. The patient had venous Doppler done in the ED for his right lower extremity which did not show any evidence of DVT. As well, the patient was noticed to have multiple lab abnormalities. Mainly, he was noticed to have hypokalemia with potassium of 2.7, hyponatremia with a sodium of 127. The patient is on large dose Lasix 80 mg twice a day, recently increased for worsening lower extremity edema. As well, the patient was noticed to be in Afib, rate controlled. The patient does not carry a diagnosis of Afib. This appears to be  a new diagnosis of Afib with RVR. The patient denies any fever, any chills, any new onset cough other than his baseline. Complains of productive sputum, white, which is chronic. No nausea. No vomiting. No chest pain. No lightheadedness. No dizziness. The patient's chest x-ray shows possible right lung infiltrate versus atelectasis. The patient was afebrile in the ED. The patient is known to have history of hypertension. Blood pressure was on the lower side in the ED.   PAST MEDICAL HISTORY:  1. Chronic respiratory failure, on 2 liters nasal cannula at baseline.  2. COPD, followed by Dr. Saadat Khan.   3. Sleep apnea, noncompliant with BiPAP.  4. History of CVA in the past.  5. Hypertension.  6. Rheumatoid arthritis, followed by Dr. Wallace Kernodle.   7. History of osteoarthritis.   PAST SURGICAL HISTORY:  1. Left inguinal hernia repair in 2009.  2. Multiple colon polypectomies.   ALLERGIES:  1. ATIVAN.  2. BIAXIN.  3. KLONOPIN.  4. PENICILLIN.   SOCIAL HISTORY: The patient smoked in the past. Quit smoking in 1998. No alcohol. No illicit drug use.   FAMILY HISTORY: Significant for breast cancer. No family history of coronary artery disease at a young age.   HOME MEDICATIONS:  1. Imdur 30 mg oral daily.  2. Coreg 3.125 mg oral 2 times a day.  3. Advair Diskus 500/50 one puff b.i.d.  4. DuoNeb as needed.  5. Spiriva 18 mcg inhalational daily.  6. Theophylline extended release 100 mg 2 times a day.  7. Lasix 80   mg 2 times a day.  8. Metolazone 5 mg oral daily.  9. Mucinex 600 mg every 12 hours as needed.  10. Senna 2 tablets 2 times a day.  11. Klor-Con M20 one tablet 4 times a day.  12. Protonix 40 mg oral 4 times a day.   REVIEW OF SYSTEMS:  CONSTITUTIONAL: The patient denies fever or chills. Complains of fatigue, weakness. Denies weight gain, weight loss.  EYES: Denies blurry vision, double vision, inflammation.  ENT: Denies tinnitus, ear pain, hearing loss, epistaxis.   RESPIRATORY: Denies asthma, painful respiratory. Reports history of COPD and baseline cough, white productive sputum occasional which is his baseline.  CARDIOVASCULAR: Denies chest pain, arrhythmia, palpitation, syncope, dyspnea on exertion. Has lower extremity edema, chronic.  GASTROINTESTINAL: Denies nausea, vomiting, diarrhea, abdominal pain, melena, jaundice.  GENITOURINARY: Denies dysuria, hematuria, renal colic.  ENDOCRINE: Denies polyuria, polydipsia, heat or cold intolerance.  HEMATOLOGIC: Denies anemia, easy bruising, bleeding diathesis.  INTEGUMENTARY: Complaints of right lower extremity rash and bilateral lower extremity varicose veins.   MUSCULOSKELETAL: Denies any gout. Has limited activity. Ambulates only with a walker. Has history of arthritis.  NEUROLOGIC: Denies headache, ataxia, vertigo, tremors, migraine.  PSYCHIATRIC: Denies anxiety, insomnia, depression or schizophrenia.   PHYSICAL EXAMINATION:  VITAL SIGNS: Temperature 97.7, pulse 92, respiratory rate 20, blood pressure 114/51, saturating 87% on oxygen.  GENERAL: Elderly frail male, looks comfortable, in no apparent distress.  HEENT: Head atraumatic, normocephalic. Pupils are equal, reactive to light. Pink conjunctivae. Anicteric sclerae. Dry oral mucosa.  NECK: Supple. No thyromegaly. No JVD.  CHEST: Good air entry bilaterally. No wheezing, rales or rhonchi.  CARDIOVASCULAR: S1, S2 heard. No rubs, murmurs or gallops. Currently regular rate and rhythm.  ABDOMEN: Soft, nontender, nondistended. Bowel sounds present.  EXTREMITIES: Edema +1 bilaterally, with right lower extremity erythema and warmth up to midcalf area. Pedal pulses felt bilaterally but diminished. No evidence of cyanosis or ischemia.  PSYCHIATRIC: Appropriate affect. Awake, alert x 3. Intact judgment and insight.  NEUROLOGIC: Cranial nerves grossly intact. Motor 5 out of 5. No focal deficits.  LYMPHATICS: No cervical or supraclavicular lymphadenopathy.     PERTINENT LABORATORIES: Glucose 115, BUN 15, creatinine 1.16, sodium 127, potassium 2.7, chloride 80, CO2 42. Lipase 65. ALT 25, AST 39, alk phos 107. Troponin less than 0.02. White blood cells 10.2, hemoglobin 15.6, hematocrit 46.1, platelets 144. Urinalysis negative for leukocyte esterase and nitrite. ABG showing pH of 7.52, pCO2 of 60 and pO2 of 45.   IMAGING: Chest x-ray, portable, showing hazy ill-defined increased density at the right lung base, most likely atelectatic. Infiltrate is not excluded but is felt to be less likely.   Venous Doppler of the right lower extremity showing no evidence of right lower extremity DVT.   ASSESSMENT AND PLAN:  1. Cellulitis: The patient does appear to be having right lower extremity cellulitis, but he is afebrile. No leukocytosis. The patient will be started on intravenous levofloxacin. Adjust antibiotic as well to cover for his possible community-acquired pneumonia with questionable right lung infiltrate.  2. New onset atrial fibrillation: Currently, the patient is back to normal sinus rhythm on the telemetry monitor. He is rate controlled regardless. Will continue to cycle cardiac enzymes. Will have him on telemetry monitor. Will consult cardiology at this point. Daughter reports the patient had multiple falls in the past, so he would not be a good candidate for anticoagulation.  3. Hypokalemia: This is most likely due to diuresis. He is on large dose Lasix and metolazone.  Will replace. Will check magnesium level. Will replace as needed as well. There is questionable congestive heart failure, but besides the lower extremity edema, the patient does not have any other signs of congestive heart failure. No volume overload picture on the lung. No jugular venous distention. Will check BNP. Will check echocardiogram as well.  4. Hyponatremia: This is due to volume depletion and dehydration. Will give the patient 1 liter of normal saline only due to possible  congestive heart failure and will recheck level in a.m.  5. Obstructive sleep apnea: Will have the patient on BiPAP at nighttime.  6. Chronic obstructive pulmonary disease: Does not appear to be in exacerbation. Will continue him on DuoNeb, Spiriva, Advair and oxygen 2 liters nasal cannula.  7. Hypertension: Actually, blood pressure is on the lower side, so will hold all of his antihypertensive medications. When the patient is appropriately hydrated, he can be resumed back on Coreg and Imdur.  8. Deep vein thrombosis prophylaxis: Subcutaneous heparin.  9. Gastrointestinal prophylaxis: On proton pump inhibitor.   CODE STATUS: The patient reports he does not have a Living Will. He reports he is a FULL CODE. The patient's daughters at bedside, and all of the family's questions were answered.   TOTAL TIME SPENT ON ADMISSION AND PATIENT CARE: 55 minutes.   ____________________________ Dawood S. Elgergawy, MD dse:gb D: 12/10/2012 00:53:11 ET T: 12/10/2012 02:32:40 ET JOB#: [id number]  cc: Dawood S. Elgergawy, MD, <Dictator> DAWOOD S ELGERGAWY MD ELECTRONICALLY SIGNED 12/11/2012 7:08 

## 2014-06-05 NOTE — Consult Note (Signed)
   Comments   Approached by pt's daughter in hallway concerning involvement of [facility] for home health at Dr Suzi RootsSparks's recommendation. I spoke with CSW who referred me to CM. Spoke with CM by phone and informed them of family's request for [facility] home health. CM to arrange. I informed daughter that CM would be by to see pt.   Electronic Signatures: Aveen Stansel, Harriett SineNancy (MD)  (Signed 30-Oct-14 10:21)  Authored: Palliative Care   Last Updated: 30-Oct-14 10:21 by Kahlyn Shippey, Harriett SineNancy (MD)

## 2014-06-05 NOTE — Discharge Summary (Signed)
PATIENT NAME:  Andrew [name], Andrew [name] MR#:  [id number] DATE OF BIRTH:  1930/04/11  DATE OF ADMISSION:  12/10/2012 DATE OF DISCHARGE:  12/12/2012  REASON FOR ADMISSION: Lethargy and weakness.   HISTORY OF PRESENT ILLNESS: Please see the dictated HPI done by Dr. Randol KernElgergawy on 12/10/2012.   PAST MEDICAL HISTORY:  1. Chronic respiratory failure.  2. COPD.  3. Obstructive sleep apnea.  4. Benign hypertension.  5. Diastolic congestive heart failure.  6. Previous stroke.  7. Rheumatoid arthritis.  8. Osteoarthritis.  9. Colonic polyps.  10. Status post left inguinal hernia repair.   MEDICATIONS ON ADMISSION: Please see admission note.   ALLERGIES: LORAZEPAM, BIAXIN, KLONOPIN AND PENICILLIN.   SOCIAL HISTORY, FAMILY HISTORY AND REVIEW OF SYSTEMS: As per admission note.   PHYSICAL EXAMINATION:  GENERAL: The patient was in no acute distress.  VITAL SIGNS: Stable, and he was afebrile.  HEENT: Unremarkable, except for dry oral mucosa.  NECK: Supple without JVD.  LUNGS: Essentially clear.  CARDIAC: Revealed a regular rate and rhythm with a normal S1 and S2.  ABDOMEN: Soft and nontender.  EXTREMITIES: Reveal 1+ edema.  NEUROLOGIC: Grossly nonfocal.  SKIN: Revealed lower extremity changes consistent with cellulitis.   HOSPITAL COURSE: The patient was admitted with left lower extremity cellulitis with paroxysmal atrial fibrillation and altered mental status due to metabolic encephalopathy. He was seen by cardiology, who recommended conservative therapy. He was maintained on oxygen. He was placed on IV antibiotics with IV fluids. His symptoms improved. He remained a full code during the hospitalization. Hospice was recommended, but the family and patient deferred. The patient was subsequently taken off IV antibiotics and switched to oral antibiotics, with improvement of his mental status and cellulitis. He was extremely hypokalemic on admission. This was replaced and normalized. By 12/12/2012, the patient  was stable and ready for discharge.   DISCHARGE DIAGNOSES:  1. Metabolic encephalopathy.  2. Hypokalemia.  3. Lower extremity cellulitis.  4. Chronic diastolic congestive heart failure.  5. Chronic respiratory failure.  6. Chronic obstructive pulmonary disease.  7. Benign hypertension.  8. Hyperlipidemia.   DISCHARGE MEDICATIONS:  1. DuoNeb SVNs q.i.d.  2. Advair 500/50 one puff b.i.d.  3. Theophylline 100 mg p.o. b.i.d.  4. Spiriva 1 capsule inhaled daily.  5. Lasix 20 mg p.o. daily.  6. Mucinex 600 mg p.o. b.i.d.  7. Senna 1 p.o. b.i.d. as needed for constipation.  8. Klor-Con 40 mEq p.o. b.i.d.  9. Cortisporin eyedrops as directed.  10. Protonix 40 mg p.o. daily.  11. Levaquin 500 mg p.o. daily for 1 week.  12. Oxygen at 4 liters per minute per nasal cannula.   FOLLOW-UP PLANS AND APPOINTMENTS: The patient was discharged on a low sodium diet with oxygen. He will be followed by home health. He will follow up with me within 1 week's time, sooner if needed.   ____________________________ Duane LopeJeffrey D. Judithann SheenSparks, MD jds:lb D: 12/28/2012 10:22:05 ET T: 12/28/2012 11:18:26 ET JOB#: [id number]  cc: Duane LopeJeffrey D. Judithann SheenSparks, MD, <Dictator> JEFFREY Rodena Medin SPARKS MD ELECTRONICALLY SIGNED 12/28/2012 13:14
